# Patient Record
Sex: Male | Born: 1975 | Race: White | Hispanic: No | Marital: Single | Smoking: Former smoker
Health system: Southern US, Community
[De-identification: ages and names within clinical notes are randomized; demographics above are authoritative.]

## PROBLEM LIST (undated history)

## (undated) DIAGNOSIS — T148XXA Other injury of unspecified body region, initial encounter: Secondary | ICD-10-CM

## (undated) DIAGNOSIS — Z9889 Other specified postprocedural states: Secondary | ICD-10-CM

## (undated) DIAGNOSIS — R748 Abnormal levels of other serum enzymes: Secondary | ICD-10-CM

## (undated) DIAGNOSIS — L089 Local infection of the skin and subcutaneous tissue, unspecified: Secondary | ICD-10-CM

## (undated) DIAGNOSIS — S3981XA Other specified injuries of abdomen, initial encounter: Secondary | ICD-10-CM

## (undated) DIAGNOSIS — R112 Nausea with vomiting, unspecified: Secondary | ICD-10-CM

## (undated) DIAGNOSIS — M1990 Unspecified osteoarthritis, unspecified site: Secondary | ICD-10-CM

## (undated) DIAGNOSIS — K219 Gastro-esophageal reflux disease without esophagitis: Secondary | ICD-10-CM

## (undated) HISTORY — PX: BACK SURGERY: SHX140

## (undated) HISTORY — PX: VENTRICULOSTOMY: SUR1435

## (undated) HISTORY — PX: KNEE ARTHROSCOPY WITH MENISCAL REPAIR: SHX5653

## (undated) HISTORY — PX: FINGER SURGERY: SHX640

## (undated) HISTORY — PX: ANTERIOR CRUCIATE LIGAMENT REPAIR: SHX115

## (undated) HISTORY — PX: WISDOM TOOTH EXTRACTION: SHX21

## (undated) HISTORY — PX: RHINOPLASTY: SUR1284

## (undated) HISTORY — PX: ELBOW FRACTURE SURGERY: SHX616

---

## 2001-10-21 ENCOUNTER — Observation Stay (HOSPITAL_COMMUNITY): Admission: RE | Admit: 2001-10-21 | Discharge: 2001-10-22 | Payer: Self-pay | Admitting: Orthopedic Surgery

## 2003-10-06 ENCOUNTER — Emergency Department (HOSPITAL_COMMUNITY): Admission: EM | Admit: 2003-10-06 | Discharge: 2003-10-06 | Payer: Self-pay | Admitting: Emergency Medicine

## 2004-10-29 ENCOUNTER — Inpatient Hospital Stay (HOSPITAL_COMMUNITY): Admission: EM | Admit: 2004-10-29 | Discharge: 2004-10-30 | Payer: Self-pay | Admitting: Emergency Medicine

## 2007-09-30 ENCOUNTER — Emergency Department (HOSPITAL_COMMUNITY): Admission: EM | Admit: 2007-09-30 | Discharge: 2007-09-30 | Payer: Self-pay | Admitting: Emergency Medicine

## 2008-09-11 ENCOUNTER — Emergency Department (HOSPITAL_BASED_OUTPATIENT_CLINIC_OR_DEPARTMENT_OTHER): Admission: EM | Admit: 2008-09-11 | Discharge: 2008-09-11 | Payer: Self-pay | Admitting: Emergency Medicine

## 2008-09-11 ENCOUNTER — Ambulatory Visit: Payer: Self-pay | Admitting: Radiology

## 2009-02-17 ENCOUNTER — Encounter: Admission: RE | Admit: 2009-02-17 | Discharge: 2009-02-17 | Payer: Self-pay | Admitting: Family Medicine

## 2010-03-24 ENCOUNTER — Emergency Department (HOSPITAL_COMMUNITY)
Admission: EM | Admit: 2010-03-24 | Discharge: 2010-03-24 | Payer: Self-pay | Source: Home / Self Care | Admitting: Emergency Medicine

## 2010-04-04 ENCOUNTER — Emergency Department (HOSPITAL_COMMUNITY)
Admission: EM | Admit: 2010-04-04 | Discharge: 2010-04-04 | Payer: Self-pay | Source: Home / Self Care | Admitting: Emergency Medicine

## 2010-05-07 IMAGING — US US SCROTUM
1 series · 14 of 25 positions shown · non-contrast
Comparison: None available.

CLINICAL DATA: Right testicular pain radiating to the right hip.
Pulled groin muscle.

SCROTAL ULTRASOUND
DOPPLER ULTRASOUND OF THE TESTICLES
TECHNIQUE: Complete ultrasound examination of the testicles,
epididymis, and other scrotal structures was performed.  Color and
spectral Doppler ultrasound were also utilized to evaluate blood
flow to the testicles.

[Series 1: us scrotum · 0.08mm/px · 14 of 41 slices shown]
[im 1/41]
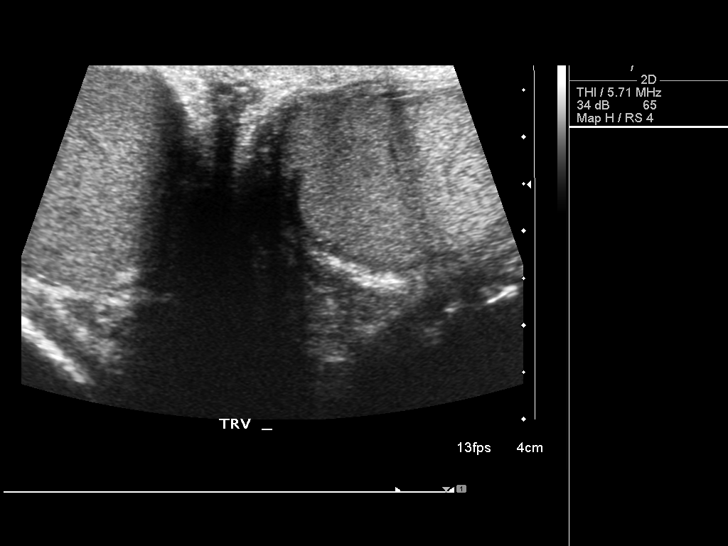
[im 4/41]
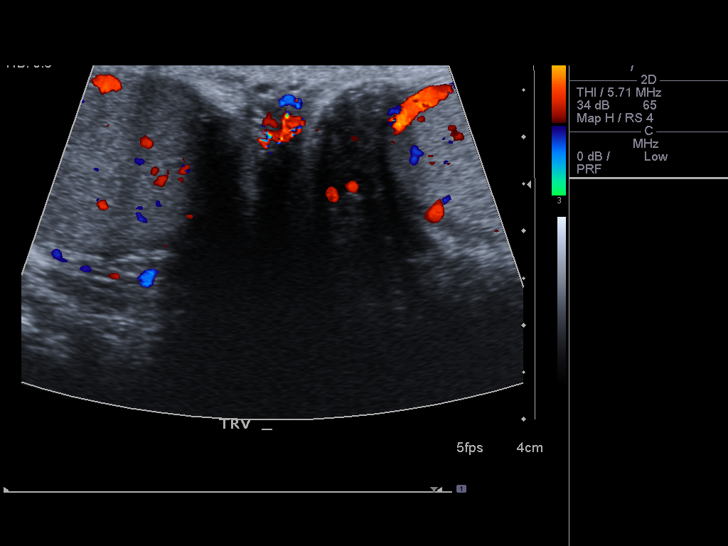
[im 7/41]
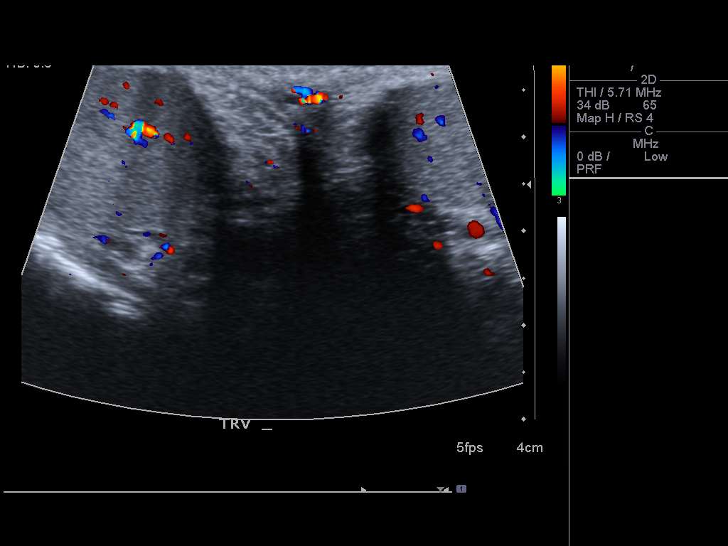
[im 11/41]
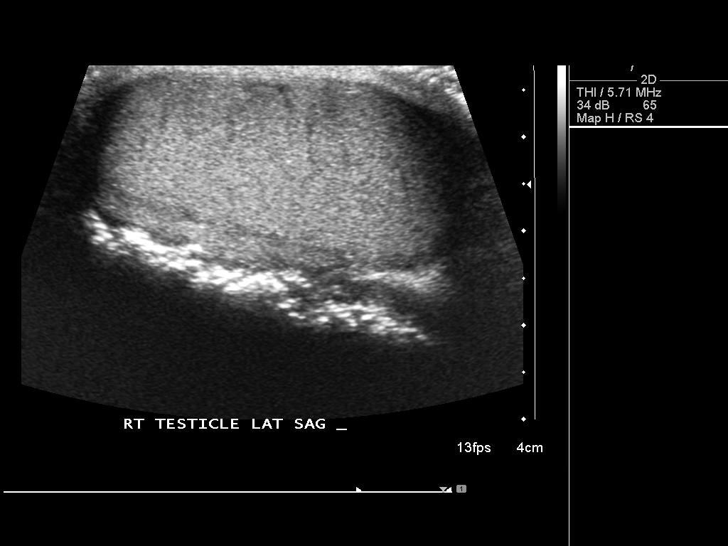
[im 14/41]
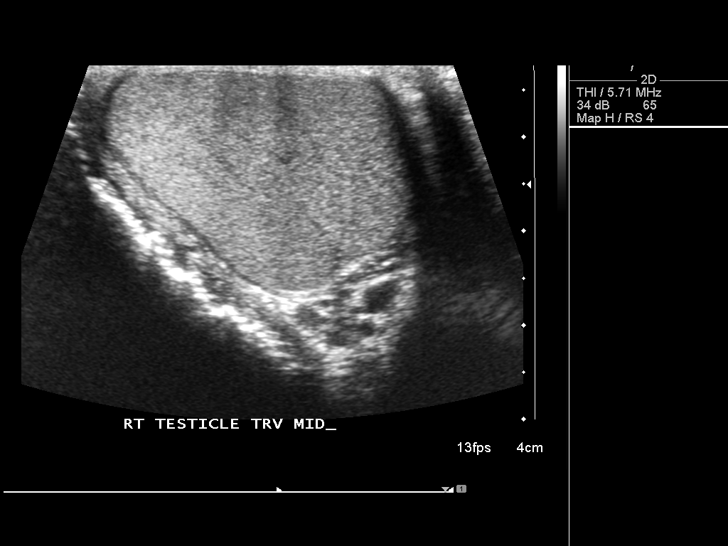
[im 16/41]
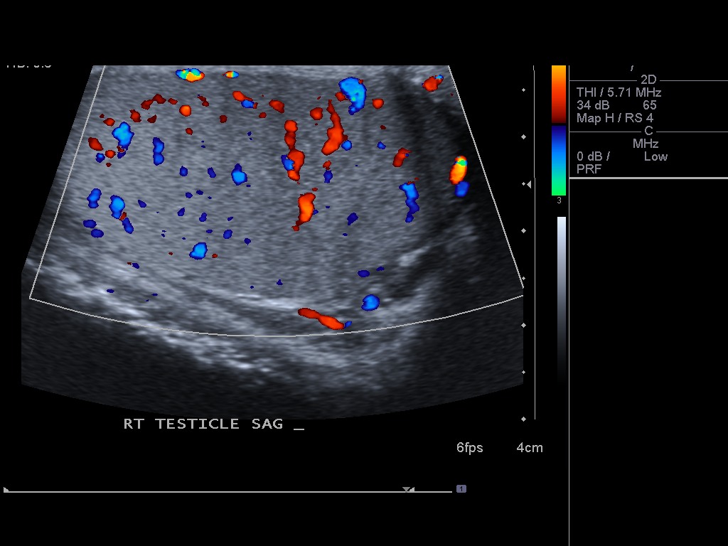
[im 19/41]
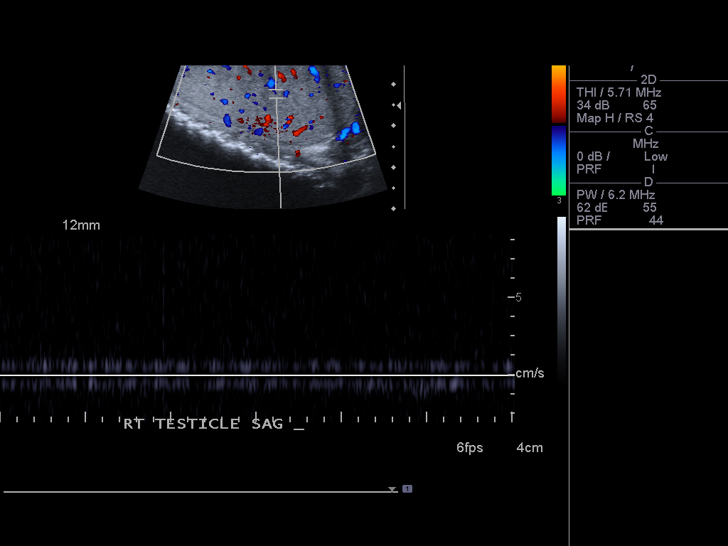
[im 22/41]
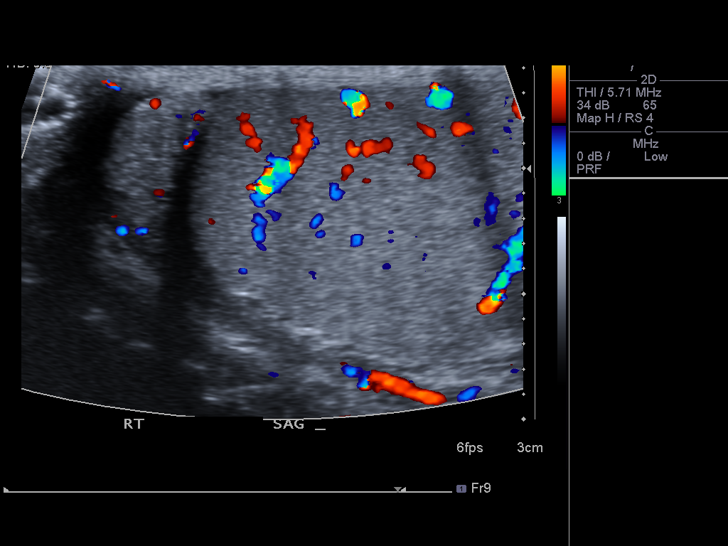
[im 26/41]
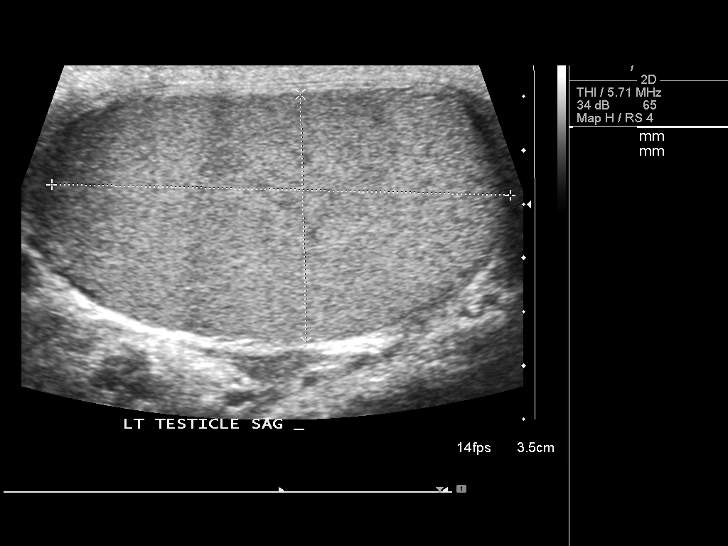
[im 27/41]
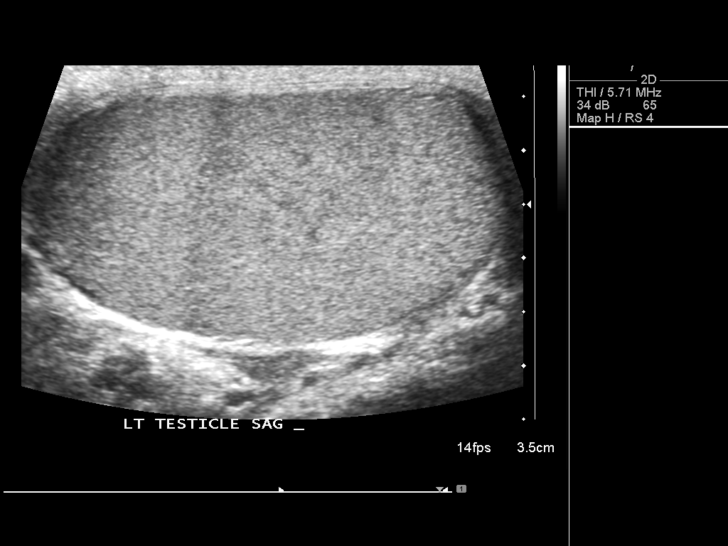
[im 31/41]
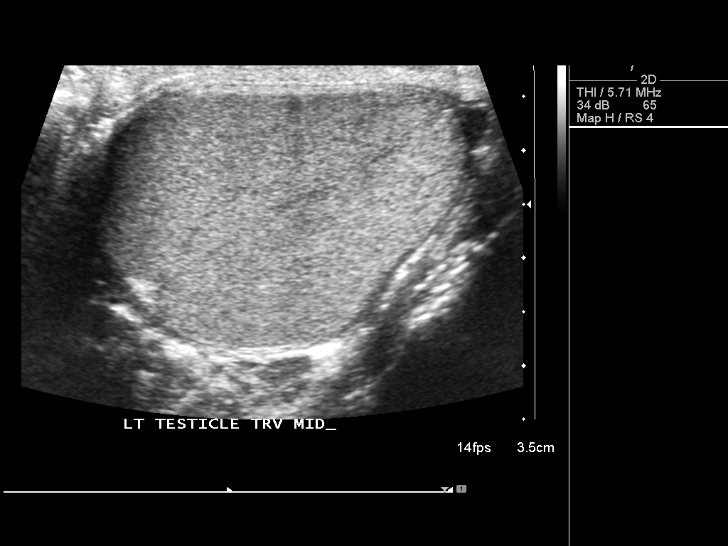
[im 34/41]
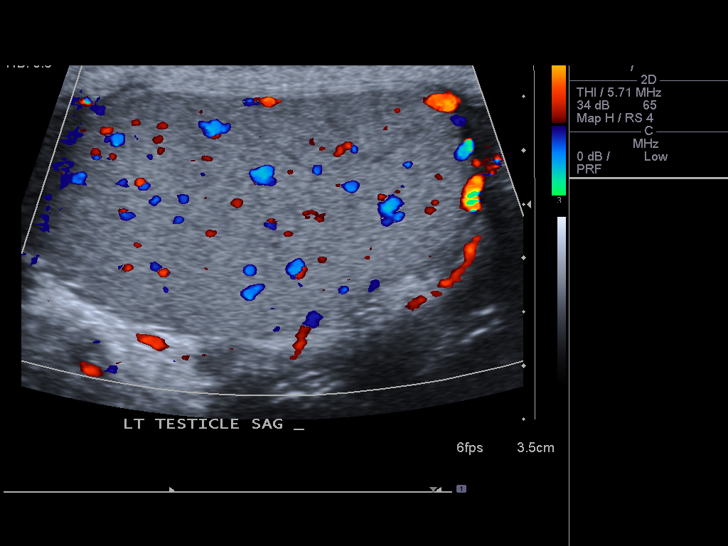
[im 37/41]
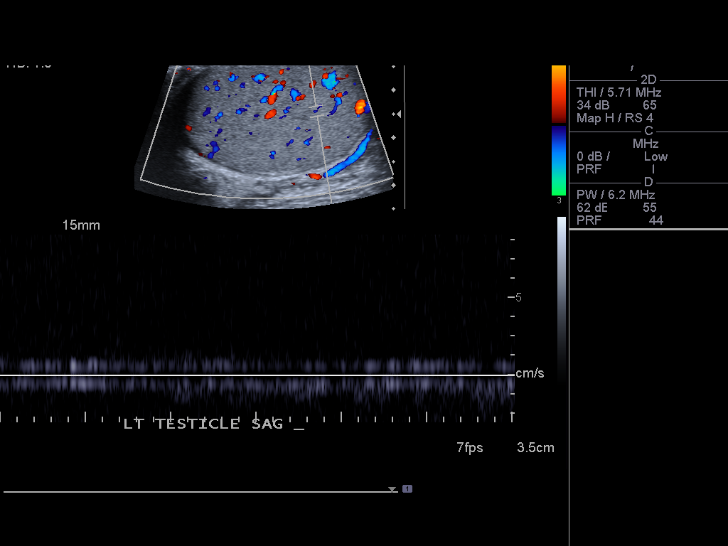
[im 41/41]
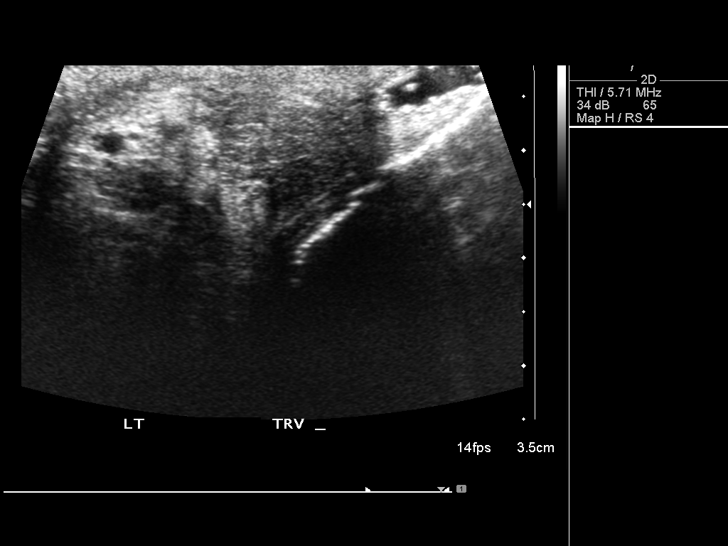

[14 of 25 positions shown; findings below may reference images not displayed]

FINDINGS: The testicles are of normal size and echotexture
bilaterally.  The right testicle measures 4.4 x 2.4 x 3.2 cm.  The
left testicle measures 4.3 x 2.3 x 3.3 cm.  Normal color Doppler
flow is seen bilaterally.  The color Doppler signal is symmetric.
Normal arterial and venous waveforms are present bilaterally.  The
epididymis is within normal limits bilaterally.  There is no
evidence for hydrocele or varicocele.
IMPRESSION: Normal bilateral scrotal ultrasound and color Doppler evaluation.

## 2010-07-30 LAB — GC/CHLAMYDIA PROBE AMP, GENITAL
Chlamydia, DNA Probe: NEGATIVE
GC Probe Amp, Genital: NEGATIVE

## 2010-07-30 LAB — URINALYSIS, ROUTINE W REFLEX MICROSCOPIC
Bilirubin Urine: NEGATIVE
Hgb urine dipstick: NEGATIVE
Ketones, ur: NEGATIVE mg/dL
Protein, ur: NEGATIVE mg/dL
Urobilinogen, UA: 0.2 mg/dL (ref 0.0–1.0)

## 2010-09-06 NOTE — Op Note (Signed)
[facility]  Patient:    Alexander Caldwell, MCPEEK Visit Number: [id number] MRN: [id number]          Service Type: SUR Location: 4W [id number] [id number] Attending Physician:  Marlowe Kays Page Dictated by:   Illene Labrador. Aplington, M.D. Proc. Date: 10/21/01 Admit Date:  10/21/2001 Discharge Date: 10/22/2001                             Operative Report  PREOPERATIVE DIAGNOSES: 1. Torn anterior cruciate ligament. 2. Torn medial meniscus, right knee.  POSTOPERATIVE DIAGNOSES: 1. Torn anterior cruciate ligament. 2. Torn medial meniscus, right knee.  OPERATION PERFORMED: 1. Arthroscopically assisted ACL reconstruction using the third patella    tendon technique. 2. Partial medial meniscectomy, right knee.  SURGEON:  Illene Labrador. Aplington, M.D.  ASSISTANT:  Malon Kindle, M.D.  ANESTHESIA:  General.  PATHOLOGY AND JUSTIFICATION FOR PROCEDURE:  Injury to his right knee playing basketball on Aug 30, 2001. MRI demonstrated the above pathology. After a thorough discussion with the patient, he is here today for the above repairs. He had the bucket-handle type tear of the medial meniscus. His joint surfaces looked good. He had a complete ACL tear.  DESCRIPTION OF PROCEDURE:  Prophylactic antibiotics, satisfactory general anesthesia, pneumatic tourniquet, thigh holder over the tourniquet, the right leg was prepped with Duraprep from tourniquet to ankle and draped in a sterile field. The anatomy of the knee portals and the incision for the taking of the patellar tendon graft were all outlined. I first arthroscoped him because his knee was stable and he did have a complete tear with the ACL adherent to the PCL. He also had the large bucket-handle type tear of the medial meniscus. We then went ahead and esmarched out the leg and I made an incision down to the patellar tendon curving slightly medial to the tibial tubercle. The patella tendon measured about 40 mm in length and I took  roughly the middle 10 mm taking 20 mm of graft from the patella and 25 from the tibia using microsaw. Dr. Ranell Patrick prepared the grafts with size 10 for the graft and 10 mm for the graft and two #5 Ethibond sutures through each end of the graft and marked the bone graft patella tendon junction. Meanwhile I was using superomedial saline inflow and through an anterolateral portal cut the bucket handle portion of the meniscus anteriorly and posteriorly and removed the large fragment. I shaved down the anterior attachment. Posteriorly he was quite tight. I came back to this area later. I then arthroscoped the suprapatellar area, found no abnormalities and then reversed portals with the lateral compartment of the knee joint being normal. Then with the camera through the anterolateral portal, I used a 4.2 shaver and probe to remove the ACL remnants and then thought that perhaps there would be a little more flexibility to his knee but still we had a difficult time even with Dr. Ranell Patrick assisting me in mobilizing the leg to get all of the posterior remnants of the medial meniscus from the intercondylar area. Accordingly, I shaved down the posterior remnant with a 3.5 shaver until smooth and we did not not want to scrape his joint surfaces. I then went back to the ACL reconstruction portion where I cleaned the remaining soft tissue from the intercondylar notch and we then brought in a curette scraping the bone back over the top bone and brought in the 4-0 oval bur and  began burring down the arch and residence ridge until we had an adequate arch for the graft. Because the tendon portion of the graft measured 105 mm, I made the tunnel a little longer in the tibia to accommodate for this and we set the stylus for the tibial hole at 60 degrees rather than the normal 55. A guidepin was placed and was found to be in position, protected with curette and over drilled. A remnant of bony fibrous tissue was removed  from the tibial plateau to make a nice smooth hole and then this was grasped from below. I then placed the footprint guide into the posterior notch around the over the top position and placed a guide pin through the tibial tunnel up to through the lateral femoral condyle and this appeared to be in good position. I then used a 10 mm C reamer and made a footprint with it and found it to have a good posterior bone coverage so I then inserted it for 30 mm for the 25 mm plug. I then removed the guide pin and smoothed this hole as well and then placed a two pin passer exiting out the anterior lateral thigh. The pin for the interference screw was placed through the medial portal and the graft was then introduced from below and we carefully placed the 25 mm portion up into the femur until it was well inside the hole we had created and I then placed a 7 mm interference screw over the guidepin locking this in. This gave a nice secure fit. We then checked to make sure that he had a negative anterior drawer sign and Lachman test. There was a slight amount of graft protruding in the tibia and we elected to use two techmatic staples to impact this to stabilize it to preserve all our graft. I then checked the stability and it was excellent. We then removed several small bone fragments in the joint, took final pictures and closed. Some of the graft left over from the plugs was used to fill the patella gap. I then closed over it with interrupted #0 Vicryl in the patellar tendon reapproximating it and the sheath over top with the same. The subcutaneous tissue was closed with 2-0 Vicryl, staples were placed at all portal sites and on this incision and I injected the portal incision with 0.5% Marcaine with adrenaline and also 20 mm in the joint. The inflap rasp was then removed and this portal closed with staples as well. The tourniquet had been released at 2 hours of tourniquet time and was down by now.  Betadine Adaptic dry sterile dressing were applied. His leg was placed in the immobilizer. He tolerated the procedure well and was taken to the recovery room in  satisfactory condition with no known complications. Dictated by:   Illene Labrador. Aplington, M.D. Attending Physician:  Joaquin Courts DD:  10/21/01 TD:  10/25/01 Job: [id number]

## 2010-11-24 ENCOUNTER — Emergency Department (HOSPITAL_COMMUNITY): Payer: Worker's Compensation

## 2010-11-24 ENCOUNTER — Emergency Department (HOSPITAL_COMMUNITY)
Admission: EM | Admit: 2010-11-24 | Discharge: 2010-11-24 | Disposition: A | Discharge status: Home / Self Care | Payer: Worker's Compensation | Attending: Emergency Medicine | Admitting: Emergency Medicine

## 2010-11-24 DIAGNOSIS — S0003XA Contusion of scalp, initial encounter: Secondary | ICD-10-CM | POA: Insufficient documentation

## 2010-11-24 DIAGNOSIS — S0990XA Unspecified injury of head, initial encounter: Secondary | ICD-10-CM | POA: Insufficient documentation

## 2010-11-24 DIAGNOSIS — M542 Cervicalgia: Secondary | ICD-10-CM | POA: Insufficient documentation

## 2010-11-24 DIAGNOSIS — R51 Headache: Secondary | ICD-10-CM | POA: Insufficient documentation

## 2010-11-24 DIAGNOSIS — IMO0002 Reserved for concepts with insufficient information to code with codable children: Secondary | ICD-10-CM | POA: Insufficient documentation

## 2010-11-24 DIAGNOSIS — S0083XA Contusion of other part of head, initial encounter: Secondary | ICD-10-CM | POA: Insufficient documentation

## 2010-11-24 DIAGNOSIS — S139XXA Sprain of joints and ligaments of unspecified parts of neck, initial encounter: Secondary | ICD-10-CM | POA: Insufficient documentation

## 2012-06-12 ENCOUNTER — Encounter (HOSPITAL_BASED_OUTPATIENT_CLINIC_OR_DEPARTMENT_OTHER): Payer: Self-pay

## 2012-06-12 ENCOUNTER — Emergency Department (HOSPITAL_BASED_OUTPATIENT_CLINIC_OR_DEPARTMENT_OTHER)
Admission: EM | Admit: 2012-06-12 | Discharge: 2012-06-12 | Disposition: A | Discharge status: Home / Self Care | Payer: Worker's Compensation | Attending: Emergency Medicine | Admitting: Emergency Medicine

## 2012-06-12 ENCOUNTER — Emergency Department (HOSPITAL_BASED_OUTPATIENT_CLINIC_OR_DEPARTMENT_OTHER): Payer: Worker's Compensation

## 2012-06-12 DIAGNOSIS — Y9289 Other specified places as the place of occurrence of the external cause: Secondary | ICD-10-CM | POA: Insufficient documentation

## 2012-06-12 DIAGNOSIS — S93409A Sprain of unspecified ligament of unspecified ankle, initial encounter: Secondary | ICD-10-CM | POA: Insufficient documentation

## 2012-06-12 DIAGNOSIS — Y939 Activity, unspecified: Secondary | ICD-10-CM | POA: Insufficient documentation

## 2012-06-12 DIAGNOSIS — X500XXA Overexertion from strenuous movement or load, initial encounter: Secondary | ICD-10-CM | POA: Insufficient documentation

## 2012-06-12 DIAGNOSIS — Y990 Civilian activity done for income or pay: Secondary | ICD-10-CM | POA: Insufficient documentation

## 2012-06-12 DIAGNOSIS — F17200 Nicotine dependence, unspecified, uncomplicated: Secondary | ICD-10-CM | POA: Insufficient documentation

## 2012-06-12 DIAGNOSIS — S8392XA Sprain of unspecified site of left knee, initial encounter: Secondary | ICD-10-CM

## 2012-06-12 DIAGNOSIS — R269 Unspecified abnormalities of gait and mobility: Secondary | ICD-10-CM | POA: Insufficient documentation

## 2012-06-12 NOTE — ED Provider Notes (Signed)
History     CSN: [id number]  Arrival date & time 06/12/12  5621   First MD Initiated Contact with Patient 06/12/12 [phone]      Chief Complaint  Patient presents with  . Knee Pain    (Consider location/radiation/quality/duration/timing/severity/associated sxs/prior treatment) HPI Comments: 37 y.o PMH right ACL repair.  He presents after hyperextending his knee stepping down from a ladder at work last night.  Left knee turned inward.  He already hyperextended his left knee 3 weeks ago.  He her a popping noise, pain has worsened overnight and he has noticed swelling.  Bearing weight walking hurts, pain is 7-8/10. Ibuprofen helped this am. Rest makes worse.  It is tender to touch lateral and medial to his patella.  He has discomfort and left leg feels tight.  Pain is also posterior.  He states he has hyperextended both knees previously playing basketball.    PMH: cracked sinus cavity, broken vertebra  Patient is a 37 y.o. male presenting with knee pain. The history is provided by the patient. No language interpreter was used.  Knee Pain Location:  Knee Knee location:  L knee Pain details:    Radiates to:  Does not radiate   Progression:  Worsening Chronicity:  New Foreign body present:  No foreign bodies Relieved by:  NSAIDs Worsened by:  Activity Associated symptoms: decreased ROM   Associated symptoms comment:  Decreased extension   History reviewed. No pertinent past medical history.  Past Surgical History  Procedure Laterality Date  . Anterior cruciate ligament repair Right   . Ventriculostomy      History reviewed. No pertinent family history.  History  Substance Use Topics  . Smoking status: Current Some Day Smoker    Types: Cigarettes  . Smokeless tobacco: Never Used  . Alcohol Use: Yes     Comment: occasionally      Review of Systems  Musculoskeletal: Positive for joint swelling and gait problem.  All other systems reviewed and are negative.    Allergies   Aspirin and Hydrocodone  Home Medications   Current Outpatient Rx  Name  Route  Sig  Dispense  Refill  . ibuprofen (ADVIL,MOTRIN) 200 MG tablet   Oral   Take 600 mg by mouth every 6 (six) hours as needed for pain.           BP 128/91  Pulse 110  Temp(Src) 98.9 F (37.2 C)  Resp 18  Ht 6' (1.829 m)  Wt 200 lb (90.719 kg)  BMI 27.12 kg/m2  SpO2 95%  Physical Exam  Nursing note and vitals reviewed. Constitutional: He is oriented to person, place, and time. He appears well-developed and well-nourished. He is cooperative.  HENT:  Head: Normocephalic and atraumatic.  Mouth/Throat: Oropharynx is clear and moist and mucous membranes are normal. No oropharyngeal exudate.  Eyes: Conjunctivae are normal. Pupils are equal, round, and reactive to light. Right eye exhibits no discharge. Left eye exhibits no discharge. No scleral icterus.  Cardiovascular: Regular rhythm, S1 normal, S2 normal and normal heart sounds.  Tachycardia present.   No murmur heard. Pulmonary/Chest: Effort normal and breath sounds normal.  Abdominal: Soft. Bowel sounds are normal. He exhibits no distension. There is no tenderness.  Musculoskeletal: He exhibits no edema.       Left knee: He exhibits decreased range of motion. Tenderness found. Medial joint line and lateral joint line tenderness noted.  Flexion intact Not able to fully extend knee  ttp medially and lateral joint line and  distal to patella Min. Edema   Neurological: He is alert and oriented to person, place, and time.  Skin: Skin is warm, dry and intact. No rash noted.  Multiple tattoos  Psychiatric: He has a normal mood and affect. His speech is normal and behavior is normal. Judgment and thought content normal. Cognition and memory are normal.    ED Course  Procedures (including critical care time)  Labs Reviewed - No data to display Dg Knee Complete 4 Views Left  06/12/2012  *RADIOLOGY REPORT*  Clinical Data: Pain and swelling post  injury yesterday  LEFT KNEE - COMPLETE 4+ VIEW  Comparison: None.  Findings: Four views of the left knee submitted.  No acute fracture or subluxation.  Small joint effusion.  IMPRESSION: No acute fracture or subluxation.  Small joint effusion.   Original Report Authenticated By: Natasha Mead, M.D.      1. Left knee sprain       MDM  Likely MCL sprain  Shirlee Latch MD [phone]        Annett Gula, MD 06/12/12 1017

## 2012-06-12 NOTE — ED Provider Notes (Addendum)
I saw and evaluated the patient, reviewed the resident's note and I agree with the findings and plan. The patient presents after twisting his knee yesterday at work stepping down from a ladder.  Pain and swelling since that time.  On exam, the patient is afebrile and the vitals are stable.  The left knee appears grossly normal, with possible a very small effusion.  There is ttp over the medial collateral ligament.  There is good range of motion without crepitus.  The knee is stable ap and laterally.    This appears to be a sprain.  The xrays do not reveal fracture, only a small effusion.  Will treat with rest, ace, nsaids, follow up prn.  Work excuse given.   Geoffery Lyons, MD 06/12/12 1041  Geoffery Lyons, MD 06/12/12 1043

## 2012-06-12 NOTE — ED Notes (Signed)
Pt states that he stepped on a cable yesterday at work and his R knee flexed inward, popping, and causing immediate pain.  Swelling noted.

## 2012-07-12 ENCOUNTER — Emergency Department (HOSPITAL_BASED_OUTPATIENT_CLINIC_OR_DEPARTMENT_OTHER): Payer: Self-pay | Attending: Emergency Medicine

## 2012-07-12 ENCOUNTER — Encounter (HOSPITAL_BASED_OUTPATIENT_CLINIC_OR_DEPARTMENT_OTHER): Payer: Self-pay

## 2012-07-12 ENCOUNTER — Emergency Department (HOSPITAL_BASED_OUTPATIENT_CLINIC_OR_DEPARTMENT_OTHER)
Admission: EM | Admit: 2012-07-12 | Discharge: 2012-07-12 | Disposition: A | Discharge status: Home / Self Care | Payer: Worker's Compensation | Attending: Emergency Medicine | Admitting: Emergency Medicine

## 2012-07-12 DIAGNOSIS — Z9889 Other specified postprocedural states: Secondary | ICD-10-CM | POA: Insufficient documentation

## 2012-07-12 DIAGNOSIS — M25462 Effusion, left knee: Secondary | ICD-10-CM

## 2012-07-12 DIAGNOSIS — M25469 Effusion, unspecified knee: Secondary | ICD-10-CM | POA: Insufficient documentation

## 2012-07-12 DIAGNOSIS — Z791 Long term (current) use of non-steroidal anti-inflammatories (NSAID): Secondary | ICD-10-CM | POA: Insufficient documentation

## 2012-07-12 DIAGNOSIS — F17200 Nicotine dependence, unspecified, uncomplicated: Secondary | ICD-10-CM | POA: Insufficient documentation

## 2012-07-12 MED ORDER — OXYCODONE-ACETAMINOPHEN 5-325 MG PO TABS
2.0000 | ORAL_TABLET | Freq: Once | ORAL | Status: AC
  Administered 2012-07-12: 2 via ORAL
  Filled 2012-07-12 (×2): qty 2

## 2012-07-12 MED ORDER — OXYCODONE-ACETAMINOPHEN 5-325 MG PO TABS: ORAL_TABLET | ORAL | Status: DC

## 2012-07-12 NOTE — ED Provider Notes (Signed)
History     CSN: [id number]  Arrival date & time 07/12/12  1813   First MD Initiated Contact with Patient 07/12/12 1931      Chief Complaint  Patient presents with  . Knee Pain    (Consider location/radiation/quality/duration/timing/severity/associated sxs/prior treatment) HPI  Alexander Caldwell is a 37 y.o. male complaining of pain and swelling to left knee. Patient was leaving his house and twisted the knee when he was walking down the steps. He did not fall there was no direct trauma to the knee. His pain sharply increased after this incident. Patient had prior injury to the knee in February. He reports the pain is severe, 8/10, exacerbated by both movement and weightbearing. He denies numbness or paresthesia.  History reviewed. No pertinent past medical history.  Past Surgical History  Procedure Laterality Date  . Anterior cruciate ligament repair Right   . Ventriculostomy      History reviewed. No pertinent family history.  History  Substance Use Topics  . Smoking status: Current Some Day Smoker    Types: Cigarettes  . Smokeless tobacco: Never Used  . Alcohol Use: Yes     Comment: occasionally      Review of Systems  Constitutional: Negative for fever.  Respiratory: Negative for shortness of breath.   Cardiovascular: Negative for chest pain.  Gastrointestinal: Negative for nausea, vomiting, abdominal pain and diarrhea.  Musculoskeletal: Positive for joint swelling and arthralgias.  All other systems reviewed and are negative.    Allergies  Aspirin and Hydrocodone  Home Medications   Current Outpatient Rx  Name  Route  Sig  Dispense  Refill  . ibuprofen (ADVIL,MOTRIN) 200 MG tablet   Oral   Take 600 mg by mouth every 6 (six) hours as needed for pain.         . naproxen (NAPROSYN) 250 MG tablet   Oral   Take 250 mg by mouth 2 (two) times daily with a meal.           BP 119/81  Pulse 93  Temp(Src) 98.1 F (36.7 C) (Oral)  Resp 18  Ht 6' (1.829  m)  Wt 203 lb (92.08 kg)  BMI 27.53 kg/m2  SpO2 98%  Physical Exam  Nursing note and vitals reviewed. Constitutional: He is oriented to person, place, and time. He appears well-developed and well-nourished. No distress.  HENT:  Head: Normocephalic.  Mouth/Throat: Oropharynx is clear and moist.  Eyes: Conjunctivae and EOM are normal. Pupils are equal, round, and reactive to light.  Neck: Normal range of motion. Neck supple.  Cardiovascular: Normal rate.   Pulmonary/Chest: Effort normal and breath sounds normal. No stridor. No respiratory distress. He has no wheezes. He has no rales. He exhibits no tenderness.  Abdominal: Soft.  Musculoskeletal: Normal range of motion.  Left knee:  No deformity, erythema or abrasions. FROM. mild effusion, no warmth. Anterior and posterior drawer show no abnormal laxity. Stable to valgus and varus stress. Joint lines are non-tender. Neurovascularly intact. Pt ambulates with antalgic gait.   Neurological: He is alert and oriented to person, place, and time.  Psychiatric: He has a normal mood and affect.    ED Course  Procedures (including critical care time)  Labs Reviewed - No data to display Dg Knee Complete 4 Views Left  07/12/2012  *RADIOLOGY REPORT*  Clinical Data: Left knee pain.  LEFT KNEE - COMPLETE 4+ VIEW  Comparison: 06/12/2012.  Findings: The joint spaces are maintained.  No acute bony findings or osteochondral  abnormality.  There is a moderate sized joint effusion.  IMPRESSION:  1.  No acute bony findings or degenerative changes. 2.  Moderate sized joint effusion.   Original Report Authenticated By: Rudie Meyer, M.D.      1. Knee effusion, left       MDM   Alexander Caldwell is a 37 y.o. male with secondary trauma to left knee. X-ray shows no bony abnormality, physical exam shows no abnormal laxity consistent with ligamentous injury. We'll put him in a knee immobilizer, crutches and encourage outpatient followup. Patient has requested a  shot to "knock him out." I advised that I cannot do that, but I will try to control his pain.   Filed Vitals:   07/12/12 1835  BP: 119/81  Pulse: 93  Temp: 98.1 F (36.7 C)  TempSrc: Oral  Resp: 18  Height: 6' (1.829 m)  Weight: 203 lb (92.08 kg)  SpO2: 98%     Pt verbalized understanding and agrees with care plan. Outpatient follow-up and return precautions given.    Discharge Medication List as of 07/12/2012  7:38 PM    START taking these medications   Details  oxyCODONE-acetaminophen (PERCOCET/ROXICET) 5-325 MG per tablet 1 to 2 tabs PO q6hrs  PRN for pain, Print               Wynetta Emery, PA-C 07/13/12 0110

## 2012-07-12 NOTE — ED Notes (Signed)
Pt states that he stepped out his door at home and injured his Knee, heard a pop and crunch,.  No other injures.  PMS intact, knee tender to palpation.

## 2012-07-12 NOTE — ED Notes (Signed)
Pt states ride is 5-10 min away. Pt awaiting ride to received medication for pain.

## 2012-07-14 NOTE — ED Provider Notes (Signed)
Medical screening examination/treatment/procedure(s) were performed by non-physician practitioner and as supervising physician I was immediately available for consultation/collaboration.  Ethelda Chick, MD 07/14/12 [phone]

## 2013-12-14 ENCOUNTER — Emergency Department (HOSPITAL_COMMUNITY)
Admission: EM | Admit: 2013-12-14 | Discharge: 2013-12-15 | Disposition: A | Discharge status: Home / Self Care | Payer: BC Managed Care – PPO | Attending: Emergency Medicine | Admitting: Emergency Medicine

## 2013-12-14 DIAGNOSIS — X838XXA Intentional self-harm by other specified means, initial encounter: Secondary | ICD-10-CM | POA: Insufficient documentation

## 2013-12-14 DIAGNOSIS — Z791 Long term (current) use of non-steroidal anti-inflammatories (NSAID): Secondary | ICD-10-CM | POA: Diagnosis not present

## 2013-12-14 DIAGNOSIS — S7010XA Contusion of unspecified thigh, initial encounter: Secondary | ICD-10-CM | POA: Insufficient documentation

## 2013-12-14 DIAGNOSIS — S300XXA Contusion of lower back and pelvis, initial encounter: Secondary | ICD-10-CM | POA: Diagnosis not present

## 2013-12-14 DIAGNOSIS — R454 Irritability and anger: Secondary | ICD-10-CM

## 2013-12-14 DIAGNOSIS — F17200 Nicotine dependence, unspecified, uncomplicated: Secondary | ICD-10-CM | POA: Insufficient documentation

## 2013-12-14 DIAGNOSIS — Z23 Encounter for immunization: Secondary | ICD-10-CM | POA: Diagnosis not present

## 2013-12-14 DIAGNOSIS — IMO0002 Reserved for concepts with insufficient information to code with codable children: Secondary | ICD-10-CM | POA: Diagnosis not present

## 2013-12-14 DIAGNOSIS — Z7289 Other problems related to lifestyle: Secondary | ICD-10-CM

## 2013-12-14 DIAGNOSIS — F911 Conduct disorder, childhood-onset type: Secondary | ICD-10-CM | POA: Diagnosis present

## 2013-12-14 DIAGNOSIS — R4689 Other symptoms and signs involving appearance and behavior: Secondary | ICD-10-CM

## 2013-12-14 LAB — CBC
HCT: 49.1 % (ref 39.0–52.0)
HEMOGLOBIN: 17.6 g/dL — AB (ref 13.0–17.0)
MCH: 30.8 pg (ref 26.0–34.0)
MCHC: 35.8 g/dL (ref 30.0–36.0)
MCV: 86 fL (ref 78.0–100.0)
Platelets: 255 10*3/uL (ref 150–400)
RBC: 5.71 MIL/uL (ref 4.22–5.81)
RDW: 12.4 % (ref 11.5–15.5)
WBC: 11.4 10*3/uL — AB (ref 4.0–10.5)

## 2013-12-14 MED ORDER — TETANUS-DIPHTH-ACELL PERTUSSIS 5-2.5-18.5 LF-MCG/0.5 IM SUSP
0.5000 mL | Freq: Once | INTRAMUSCULAR | Status: AC
  Administered 2013-12-14: 0.5 mL via INTRAMUSCULAR
  Filled 2013-12-14: qty 0.5

## 2013-12-14 MED ORDER — ACETAMINOPHEN 325 MG PO TABS: 650.0000 mg | ORAL_TABLET | ORAL | Status: DC | PRN

## 2013-12-14 MED ORDER — LORAZEPAM 1 MG PO TABS
1.0000 mg | ORAL_TABLET | Freq: Three times a day (TID) | ORAL | Status: DC | PRN
  Administered 2013-12-14: 1 mg via ORAL
  Filled 2013-12-14: qty 1

## 2013-12-14 MED ORDER — ONDANSETRON HCL 4 MG PO TABS
4.0000 mg | ORAL_TABLET | Freq: Three times a day (TID) | ORAL | Status: DC | PRN
  Administered 2013-12-15: 4 mg via ORAL
  Filled 2013-12-14: qty 1

## 2013-12-14 MED ORDER — ZOLPIDEM TARTRATE 5 MG PO TABS: 5.0000 mg | ORAL_TABLET | Freq: Every evening | ORAL | Status: DC | PRN

## 2013-12-14 NOTE — ED Notes (Signed)
Patient combative, verbally aggressive. States "I am not crazy. I am a prisoner. F*#k psychiatrist". Reports a fall off stage 8/24. Large bruise on right hip. States he was doing nothing wrong. When writer asked if he wanted pain intervention patient referred to a lethal dose of medication then commented that he knew it is illegal in [place] to assist with suicide. States he wants no more "forced dope" from hospital.  Patient encouraged to rest.  Q 15 safety checks in place.
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
## 2013-12-14 NOTE — ED Notes (Signed)
Pt taken to bathroom by [employer], changed into scrubs. Pt provided sample in cup, specimen clear and cold to touch. Advised pt we will attempt to recollect at a later date.
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
## 2013-12-15 ENCOUNTER — Encounter (HOSPITAL_COMMUNITY): Payer: Self-pay | Admitting: Registered Nurse

## 2013-12-15 DIAGNOSIS — R454 Irritability and anger: Secondary | ICD-10-CM | POA: Diagnosis present

## 2013-12-15 DIAGNOSIS — F39 Unspecified mood [affective] disorder: Secondary | ICD-10-CM

## 2013-12-15 LAB — COMPREHENSIVE METABOLIC PANEL
ALK PHOS: 85 U/L (ref 39–117)
ALT: 69 U/L — AB (ref 0–53)
AST: 42 U/L — AB (ref 0–37)
Albumin: 4.6 g/dL (ref 3.5–5.2)
Anion gap: 20 — ABNORMAL HIGH (ref 5–15)
BILIRUBIN TOTAL: 0.4 mg/dL (ref 0.3–1.2)
BUN: 6 mg/dL (ref 6–23)
CALCIUM: 9.9 mg/dL (ref 8.4–10.5)
CHLORIDE: 105 meq/L (ref 96–112)
CO2: 18 meq/L — AB (ref 19–32)
Creatinine, Ser: 0.75 mg/dL (ref 0.50–1.35)
GLUCOSE: 107 mg/dL — AB (ref 70–99)
POTASSIUM: 3.7 meq/L (ref 3.7–5.3)
SODIUM: 143 meq/L (ref 137–147)
Total Protein: 8.5 g/dL — ABNORMAL HIGH (ref 6.0–8.3)

## 2013-12-15 LAB — BASIC METABOLIC PANEL
Anion gap: 15 (ref 5–15)
BUN: 6 mg/dL (ref 6–23)
CALCIUM: 9.3 mg/dL (ref 8.4–10.5)
CO2: 22 mEq/L (ref 19–32)
Chloride: 106 mEq/L (ref 96–112)
Creatinine, Ser: 0.77 mg/dL (ref 0.50–1.35)
Glucose, Bld: 101 mg/dL — ABNORMAL HIGH (ref 70–99)
POTASSIUM: 4.1 meq/L (ref 3.7–5.3)
SODIUM: 143 meq/L (ref 137–147)

## 2013-12-15 LAB — BLOOD GAS, VENOUS
Acid-base deficit: 1.7 mmol/L (ref 0.0–2.0)
BICARBONATE: 23.1 meq/L (ref 20.0–24.0)
FIO2: 0.21 %
O2 Saturation: 78.5 %
PATIENT TEMPERATURE: 99
TCO2: 19.8 mmol/L (ref 0–100)
pCO2, Ven: 41.6 mmHg — ABNORMAL LOW (ref 45.0–50.0)
pH, Ven: 7.364 — ABNORMAL HIGH (ref 7.250–7.300)
pO2, Ven: 44.5 mmHg (ref 30.0–45.0)

## 2013-12-15 LAB — ACETAMINOPHEN LEVEL: Acetaminophen (Tylenol), Serum: <15 ug/mL (ref 10–30)

## 2013-12-15 LAB — RAPID URINE DRUG SCREEN, HOSP PERFORMED
AMPHETAMINES: NOT DETECTED
BARBITURATES: NOT DETECTED
BENZODIAZEPINES: POSITIVE — AB
Cocaine: NOT DETECTED
Opiates: NOT DETECTED
TETRAHYDROCANNABINOL: POSITIVE — AB

## 2013-12-15 LAB — ETHANOL: Alcohol, Ethyl (B): 187 mg/dL — ABNORMAL HIGH (ref 0–11)

## 2013-12-15 LAB — SALICYLATE LEVEL: Salicylate Lvl: <2 mg/dL — ABNORMAL LOW (ref 2.8–20.0)

## 2013-12-15 MED ORDER — VITAMIN B-1 100 MG PO TABS
100.0000 mg | ORAL_TABLET | Freq: Every day | ORAL | Status: DC
  Administered 2013-12-15: 100 mg via ORAL
  Filled 2013-12-15: qty 1

## 2013-12-15 MED ORDER — LORAZEPAM 1 MG PO TABS: 0.0000 mg | ORAL_TABLET | Freq: Two times a day (BID) | ORAL | Status: DC

## 2013-12-15 MED ORDER — LORAZEPAM 1 MG PO TABS
0.0000 mg | ORAL_TABLET | Freq: Four times a day (QID) | ORAL | Status: DC
  Administered 2013-12-15: 1 mg via ORAL
  Filled 2013-12-15: qty 1

## 2013-12-15 MED ORDER — THIAMINE HCL 100 MG/ML IJ SOLN: 100.0000 mg | Freq: Every day | INTRAMUSCULAR | Status: DC

## 2013-12-15 NOTE — BH Assessment (Signed)
Relayed results of assessment to Donell Sievert, PA. Per Donell Sievert, PA pt should be reevaluated by psychiatry later in AM for final disposition recommendations and to determine whether to uphold or rescind IVC.   Clista Bernhardt, [facility] Triage Specialist 12/15/2013 4:36 AM

## 2013-12-15 NOTE — Discharge Instructions (Signed)
Aggression   Physically aggressive behavior is common among small children. When frustrated or angry, toddlers may act out. Often, they will push, bite, or hit. Most children show less physical aggression as they grow up. Their language and interpersonal skills improve, too. But continued aggressive behavior is a sign of a problem. This behavior can lead to aggression and delinquency in adolescence and adulthood.   Aggressive behavior can be psychological or physical. Forms of psychological aggression include threatening or bullying others. Forms of physical aggression include:   Pushing.   Hitting.   Slapping.   Kicking.   Stabbing.   Shooting.   Raping.   PREVENTION   Encouraging the following behaviors can help manage aggression:   Respecting others and valuing differences.   Participating in school and community functions, including sports, music, after-school programs, community groups, and volunteer work.   Talking with an adult when they are sad, depressed, fearful, anxious, or angry. Discussions with a parent or other family member, counselor, teacher, or coach can help.   Avoiding alcohol and drug use.   Dealing with disagreements without aggression, such as conflict resolution. To learn this, children need parents and caregivers to model respectful communication and problem solving.   Limiting exposure to aggression and violence, such as video games that are not age appropriate, violence in the media, or domestic violence.  Document Released: 02/02/2007 Document Revised: 06/30/2011 Document Reviewed: 06/13/2010   ExitCare Patient Information 2015 [employer]. This information is not intended to replace advice given to you by your health care provider. Make sure you discuss any questions you have with your health care provider.     Anger Management   Anger is a normal human emotion. However, anger can range from mild irritation to rage. When your anger becomes harmful to yourself or others, it is  unhealthy anger.   CAUSES   There are many reasons for unhealthy anger. Many people learn how to express anger from observing how their family expressed anger. In troubled, chaotic, or abusive families, anger can be expressed as rage or even violence. Children can grow up never learning how healthy anger can be expressed. Factors that contribute to unhealthy anger include:   Drug or alcohol abuse.   Post-traumatic stress disorder.   Traumatic brain injury.  COMPLICATIONS   People with unhealthy anger tend to overreact and retaliate against a real or imagined threat. The need to retaliate can turn into violence or verbal abuse against another person. Chronic anger can lead to health problems, such as hypertension, high blood pressure, and depression.   TREATMENT   Exercising, relaxing, meditating, or writing out your feelings all can be beneficial in managing moderate anger. For unhealthy anger, the following methods may be used:   Cognitive-behavioral counseling (learning skills to change the thoughts that influence your mood).   Relaxation training.   Interpersonal counseling.   Assertive communication skills.   Medication.  Document Released: 02/02/2007 Document Revised: 06/30/2011 Document Reviewed: 06/13/2010   ExitCare Patient Information 2015 [employer]. This information is not intended to replace advice given to you by your health care provider. Make sure you discuss any questions you have with your health care provider.

## 2013-12-15 NOTE — ED Notes (Signed)
TTS complete by Harriett Sine. Probable reeval with Psychiatry in the morning.

## 2013-12-15 NOTE — [facility] Suicide Risk Assessment (Cosign Needed)
Suicide Risk Assessment  Discharge Assessment     Demographic Factors:  Male and Caucasian  Total Time spent with patient: 30 minutes  Psychiatric Specialty Exam:     Blood pressure 116/74, pulse 82, temperature 97.5 F (36.4 C), temperature source Oral, resp. rate 18, SpO2 98.00%.There is no weight on file to calculate BMI.   General Appearance: Casual   Eye Contact:: Good   Speech: Clear and Coherent and Normal Rate   Volume: Normal   Mood: Anxious   Affect: Congruent   Thought Process: Circumstantial and Goal Directed   Orientation: Full (Time, Place, and Person)   Thought Content: Rumination   Suicidal Thoughts: No   Homicidal Thoughts: No   Memory: Immediate; Good  Recent; Good  Remote; Good   Judgement: Intact   Insight: Present   Psychomotor Activity: Normal   Concentration: Good   Recall: Good   Fund of Knowledge:Good   Language: Good   Akathisia: No   Handed: Right   AIMS (if indicated):   Assets: Communication Skills  Desire for Improvement  Housing  Social Support   Sleep:   Musculoskeletal:  Strength & Muscle Tone: within normal limits  Gait & Station: normal  Patient leans: N/A   Mental Status Per Nursing Assessment::   On Admission:     Current Mental Status by Physician: Denies suicidal/homicidal, psychosis, and paranoia  Loss Factors: NA  Historical Factors: NA  Risk Reduction Factors:   Living with another person, especially a relative and Positive social support  Continued Clinical Symptoms:  None noted  Cognitive Features That Contribute To Risk:  None noted    Suicide Risk:  Minimal: No identifiable suicidal ideation.  Patients presenting with no risk factors but with morbid ruminations; may be classified as minimal risk based on the severity of the depressive symptoms  Discharge Diagnoses:  AXIS I: Mood Disorder NOS  AXIS II: Deferred  AXIS III: History reviewed. No pertinent past medical history.  AXIS IV: other  psychosocial or environmental problems  AXIS V: 61-70 mild symptoms   Plan Of Care/Follow-up recommendations:  Activity:  as tolerated Diet:  as tolerated  Is patient on multiple antipsychotic therapies at discharge:  No   Has Patient had three or more failed trials of antipsychotic monotherapy by history:  No  Recommended Plan for Multiple Antipsychotic Therapies: NA    Rankin, Shuvon, FNP-BC 12/15/2013, 11:30 AM

## 2013-12-15 NOTE — BH Assessment (Signed)
Dr. Ladona Ridgel and Denice Bors, NP recommend discharge home. Writer provided patient with a list of mental health/substance abuse outpatient referrals prior to discharge.

## 2013-12-15 NOTE — Progress Notes (Signed)
[facility] [employer],  Provided pt with a flyer for [employer] of the [place]. Patient stated that he had [employer].

## 2013-12-15 NOTE — ED Notes (Signed)
Patient discharged to home.  All discharge instructions reviewed.  Patient was given information on follow up care.  All belongings returned.  Patient left the unit ambulatory.

## 2013-12-15 NOTE — BH Assessment (Addendum)
Reviewed provider notes and requested TA equipment be placed in room for assessment.   [id number] First attempt to connect to TA equipment unsuccessful.  Clista Bernhardt, [facility] Triage Specialist 12/15/2013 4:06 AM

## 2013-12-15 NOTE — BH Assessment (Signed)
Requested a copy of IVC paperwork.   Clista Bernhardt, [facility] Triage Specialist 12/15/2013 4:35 AM
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
## 2013-12-15 NOTE — Consult Note (Signed)
Face to face evaluation and I agree with this note 

## 2013-12-15 NOTE — BH Assessment (Signed)
IVC rescinded by Dr. Taylor 
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
## 2013-12-16 NOTE — ED Provider Notes (Signed)
Medical screening examination/treatment/procedure(s) were performed by non-physician practitioner and as supervising physician I was immediately available for consultation/collaboration.   EKG Interpretation None        Purvis Sheffield, MD 12/16/13 [phone]

## 2014-06-01 ENCOUNTER — Other Ambulatory Visit: Payer: Self-pay | Admitting: Otolaryngology

## 2014-06-01 DIAGNOSIS — J329 Chronic sinusitis, unspecified: Secondary | ICD-10-CM

## 2014-06-05 ENCOUNTER — Other Ambulatory Visit: Payer: Self-pay

## 2014-06-07 ENCOUNTER — Other Ambulatory Visit: Payer: Self-pay

## 2014-06-08 ENCOUNTER — Ambulatory Visit
Admission: RE | Admit: 2014-06-08 | Discharge: 2014-06-08 | Disposition: A | Discharge status: Home / Self Care | Payer: No Typology Code available for payment source | Source: Ambulatory Visit | Attending: Otolaryngology | Admitting: Otolaryngology

## 2014-06-08 DIAGNOSIS — J329 Chronic sinusitis, unspecified: Secondary | ICD-10-CM

## 2014-09-29 ENCOUNTER — Other Ambulatory Visit: Payer: Self-pay | Admitting: Orthopedic Surgery

## 2014-09-29 DIAGNOSIS — R52 Pain, unspecified: Secondary | ICD-10-CM

## 2014-10-12 ENCOUNTER — Ambulatory Visit
Admission: RE | Admit: 2014-10-12 | Discharge: 2014-10-12 | Disposition: A | Discharge status: Home / Self Care | Payer: No Typology Code available for payment source | Source: Ambulatory Visit | Attending: Orthopedic Surgery | Admitting: Orthopedic Surgery

## 2014-10-12 DIAGNOSIS — R52 Pain, unspecified: Secondary | ICD-10-CM

## 2015-08-03 ENCOUNTER — Encounter (HOSPITAL_COMMUNITY): Payer: Self-pay | Admitting: *Deleted

## 2015-08-03 ENCOUNTER — Emergency Department (HOSPITAL_COMMUNITY)
Admission: EM | Admit: 2015-08-03 | Discharge: 2015-08-03 | Disposition: A | Discharge status: Home / Self Care | Payer: BLUE CROSS/BLUE SHIELD | Attending: Emergency Medicine | Admitting: Emergency Medicine

## 2015-08-03 DIAGNOSIS — Z79899 Other long term (current) drug therapy: Secondary | ICD-10-CM | POA: Insufficient documentation

## 2015-08-03 DIAGNOSIS — F17210 Nicotine dependence, cigarettes, uncomplicated: Secondary | ICD-10-CM | POA: Insufficient documentation

## 2015-08-03 DIAGNOSIS — M5442 Lumbago with sciatica, left side: Secondary | ICD-10-CM | POA: Insufficient documentation

## 2015-08-03 DIAGNOSIS — G8929 Other chronic pain: Secondary | ICD-10-CM | POA: Diagnosis not present

## 2015-08-03 DIAGNOSIS — M549 Dorsalgia, unspecified: Secondary | ICD-10-CM | POA: Diagnosis present

## 2015-08-03 MED ORDER — PREDNISONE 10 MG (21) PO TBPK: 10.0000 mg | ORAL_TABLET | Freq: Every day | ORAL | Status: DC

## 2015-08-03 MED ORDER — METHOCARBAMOL 500 MG PO TABS: 500.0000 mg | ORAL_TABLET | Freq: Two times a day (BID) | ORAL | Status: DC

## 2015-08-03 MED ORDER — DEXAMETHASONE SODIUM PHOSPHATE 10 MG/ML IJ SOLN
10.0000 mg | Freq: Once | INTRAMUSCULAR | Status: AC
  Administered 2015-08-03: 10 mg via INTRAMUSCULAR
  Filled 2015-08-03: qty 1

## 2015-08-03 MED ORDER — MELOXICAM 15 MG PO TABS: 15.0000 mg | ORAL_TABLET | Freq: Every day | ORAL | Status: DC

## 2015-08-03 MED ORDER — LIDOCAINE 5 % EX PTCH
1.0000 | MEDICATED_PATCH | CUTANEOUS | Status: DC
  Administered 2015-08-03: 1 via TRANSDERMAL
  Filled 2015-08-03: qty 1

## 2015-08-03 MED ORDER — KETOROLAC TROMETHAMINE 60 MG/2ML IM SOLN
60.0000 mg | Freq: Once | INTRAMUSCULAR | Status: AC
  Administered 2015-08-03: 60 mg via INTRAMUSCULAR
  Filled 2015-08-03: qty 2

## 2015-08-03 NOTE — ED Notes (Signed)
Pt is in stable condition upon d/c and ambulates from ED. 

## 2015-08-03 NOTE — ED Provider Notes (Signed)
CSN: [id number]     Arrival date & time 08/03/15  9147 History  By signing my name below, I, Linna Darner, attest that this documentation has been prepared under the direction and in the presence of non-physician practitioner, Harolyn Rutherford, PA-C. Electronically Signed: Linna Darner, Scribe. 08/03/2015. 9:03 AM.    Chief Complaint  Patient presents with  . Back Pain    The history is provided by the patient. No language interpreter was used.     HPI Comments: Alexander Caldwell is a 40 y.o. male with h/o sciatica who presents to the Emergency Department complaining of constant, severe, 10/10 pain related to sciatica beginning early this morning. Pt endorses pain from his left buttock through his left thigh and into his left knee. Pt reports that he had a flare up of sciatica two weeks ago and received a steroid shot, prednisone prescription, and muscle relaxer from his PCP, Dr. Arlyce Dice. He notes that the steroid shot alleviated his symptoms significantly; he also notes that he has been elevating his left leg as well as applying ice and heat to his painful areas with moderate relief. Pt has never seen an orthopedic surgeon nor a neurosurgeon for his symptoms. Patient states that he may have "overdone it at work." Pt denies changes in bowel or bladder control, sensory deficits, fever/chills, or any other complaints. He further denies h/o HIV, IV drug use, and h/o cancer. Pt came to the ER today because his PCP office is closed.  History reviewed. No pertinent past medical history. Past Surgical History  Procedure Laterality Date  . Anterior cruciate ligament repair Right   . Ventriculostomy     History reviewed. No pertinent family history. Social History  Substance Use Topics  . Smoking status: Current Some Day Smoker    Types: Cigarettes  . Smokeless tobacco: Never Used  . Alcohol Use: Yes     Comment: occasionally    Review of Systems  Constitutional: Negative for fever.  Genitourinary:  Negative for dysuria and difficulty urinating.  Musculoskeletal: Positive for arthralgias (left knee, left hip, left buttock).  Neurological: Negative for numbness.   Allergies  Aspirin and Hydrocodone  Home Medications   Prior to Admission medications   Medication Sig Start Date End Date Taking? Authorizing Provider  cyclobenzaprine (FLEXERIL) 5 MG tablet Take 5 mg by mouth 3 (three) times daily as needed for muscle spasms.    Historical Provider, MD  meloxicam (MOBIC) 15 MG tablet Take 1 tablet (15 mg total) by mouth daily. 08/03/15   Shawn C Joy, PA-C  methocarbamol (ROBAXIN) 500 MG tablet Take 1 tablet (500 mg total) by mouth 2 (two) times daily. 08/03/15   Shawn C Joy, PA-C  montelukast (SINGULAIR) 10 MG tablet Take 10 mg by mouth daily as needed (for allergies).    Historical Provider, MD  multivitamin-iron-minerals-folic acid (CENTRUM) chewable tablet Chew 1 tablet by mouth daily.    Historical Provider, MD  Omega-3 Fatty Acids (FISH OIL PO) Take 1 capsule by mouth 3 (three) times daily.    Historical Provider, MD  predniSONE (STERAPRED UNI-PAK 21 TAB) 10 MG (21) TBPK tablet Take 1 tablet (10 mg total) by mouth daily. Take 6 tabs by mouth daily  for 2 days, then 5 tabs for 2 days, then 4 tabs for 2 days, then 3 tabs for 2 days, 2 tabs for 2 days, then 1 tab by mouth daily for 2 days 08/03/15   Shawn C Joy, PA-C   BP 116/72 mmHg  Pulse 82  Temp(Src) 98.5 F (36.9 C) (Oral)  Resp 20  Ht 6' (1.829 m)  Wt 94.348 kg  BMI 28.20 kg/m2  SpO2 95% Physical Exam  Constitutional: He appears well-developed and well-nourished. No distress.  HENT:  Head: Normocephalic and atraumatic.  Eyes: Conjunctivae are normal.  Cardiovascular: Normal rate, regular rhythm and intact distal pulses.   Pulmonary/Chest: Effort normal.  Musculoskeletal:  Tenderness to the left lower lumbar and sacral musculature. Full ROM in all extremities and spine. No paraspinal tenderness.   Neurological: He is alert. He  has normal reflexes.  No sensory deficits. Strength 5/5 in all extremities. No gait disturbance. Coordination intact.   Skin: Skin is warm and dry. He is not diaphoretic.  Nursing note and vitals reviewed.   ED Course  Procedures (including critical care time)  DIAGNOSTIC STUDIES: Oxygen Saturation is 98% on RA, normal by my interpretation.    COORDINATION OF CARE: 9:03 AM Discussed treatment plan with pt at bedside and pt agreed to plan.  Medications  dexamethasone (DECADRON) injection 10 mg (10 mg Intramuscular Given 08/03/15 0926)  ketorolac (TORADOL) injection 60 mg (60 mg Intramuscular Given 08/03/15 0927)    MDM   Final diagnoses:  Left-sided low back pain with left-sided sciatica    Alexander Caldwell presents with acute on chronic left lower back pain radiating down the left leg.  Patient's presentation and physical exam is consistent with symptoms of sciatica. Patient has no neuro or functional deficits. No red flag symptoms. Patient specifically requested that no narcotics be prescribed. Patient states he can take NSAIDs without difficulty, despite his documented aspirin allergy. Patient improved with conservative management here in the ED. Patient to follow-up with orthopedics should symptoms continue. Home care and return precautions discussed. Patient voiced understanding of these instructions and is comfortable with discharge.  I personally performed the services described in this documentation, which was scribed in my presence. The recorded information has been reviewed and is accurate.   Anselm PancoastShawn C Joy, PA-C 08/03/15 1719  Loren Raceravid Yelverton, MD 08/07/15 1655

## 2015-08-03 NOTE — ED Notes (Signed)
Pt reports recent diagnosis of sciatica. Having severe pain to lower back and into left buttock, hip and upper leg. Reports left knee has been giving out on him recently. No acute distress noted at triage.

## 2015-08-03 NOTE — Discharge Instructions (Signed)
You have been seen today for sciatica pain. He should follow-up with orthopedics should symptoms continue. Call the number provided to set up an appointment. Follow up with PCP as needed. Return to ED should symptoms worsen or you have serious symptoms, such as those listed on the attached literature.

## 2015-08-08 ENCOUNTER — Other Ambulatory Visit: Payer: Self-pay | Admitting: Family Medicine

## 2015-08-08 DIAGNOSIS — M5440 Lumbago with sciatica, unspecified side: Secondary | ICD-10-CM

## 2015-08-12 ENCOUNTER — Ambulatory Visit
Admission: RE | Admit: 2015-08-12 | Discharge: 2015-08-12 | Disposition: A | Discharge status: Home / Self Care | Payer: BLUE CROSS/BLUE SHIELD | Source: Ambulatory Visit | Attending: Family Medicine | Admitting: Family Medicine

## 2015-08-12 DIAGNOSIS — M5440 Lumbago with sciatica, unspecified side: Secondary | ICD-10-CM

## 2016-12-16 ENCOUNTER — Ambulatory Visit
Admission: RE | Admit: 2016-12-16 | Discharge: 2016-12-16 | Disposition: A | Discharge status: Home / Self Care | Payer: No Typology Code available for payment source | Source: Ambulatory Visit | Attending: Family Medicine | Admitting: Family Medicine

## 2016-12-16 ENCOUNTER — Other Ambulatory Visit (HOSPITAL_COMMUNITY): Payer: Self-pay | Admitting: Psychiatry

## 2016-12-16 ENCOUNTER — Other Ambulatory Visit: Payer: Self-pay | Admitting: Family Medicine

## 2016-12-16 DIAGNOSIS — S59901A Unspecified injury of right elbow, initial encounter: Secondary | ICD-10-CM

## 2017-04-12 ENCOUNTER — Emergency Department (HOSPITAL_COMMUNITY)
Admission: EM | Admit: 2017-04-12 | Discharge: 2017-04-12 | Disposition: A | Discharge status: Home / Self Care | Payer: Managed Care, Other (non HMO) | Attending: Emergency Medicine | Admitting: Emergency Medicine

## 2017-04-12 ENCOUNTER — Encounter (HOSPITAL_COMMUNITY): Payer: Self-pay | Admitting: Emergency Medicine

## 2017-04-12 ENCOUNTER — Other Ambulatory Visit: Payer: Self-pay

## 2017-04-12 ENCOUNTER — Emergency Department (HOSPITAL_COMMUNITY): Payer: Managed Care, Other (non HMO)

## 2017-04-12 DIAGNOSIS — F17210 Nicotine dependence, cigarettes, uncomplicated: Secondary | ICD-10-CM | POA: Insufficient documentation

## 2017-04-12 DIAGNOSIS — Y939 Activity, unspecified: Secondary | ICD-10-CM | POA: Insufficient documentation

## 2017-04-12 DIAGNOSIS — S02642A Fracture of ramus of left mandible, initial encounter for closed fracture: Secondary | ICD-10-CM | POA: Insufficient documentation

## 2017-04-12 DIAGNOSIS — Y999 Unspecified external cause status: Secondary | ICD-10-CM | POA: Insufficient documentation

## 2017-04-12 DIAGNOSIS — Z79899 Other long term (current) drug therapy: Secondary | ICD-10-CM | POA: Insufficient documentation

## 2017-04-12 DIAGNOSIS — F10929 Alcohol use, unspecified with intoxication, unspecified: Secondary | ICD-10-CM

## 2017-04-12 DIAGNOSIS — Y929 Unspecified place or not applicable: Secondary | ICD-10-CM | POA: Insufficient documentation

## 2017-04-12 DIAGNOSIS — S022XXA Fracture of nasal bones, initial encounter for closed fracture: Secondary | ICD-10-CM | POA: Insufficient documentation

## 2017-04-12 MED ORDER — OXYCODONE-ACETAMINOPHEN 5-325 MG PO TABS: 2.0000 | ORAL_TABLET | Freq: Four times a day (QID) | ORAL | 0 refills | Status: DC | PRN

## 2017-04-12 MED ORDER — FENTANYL CITRATE (PF) 100 MCG/2ML IJ SOLN
50.0000 ug | Freq: Once | INTRAMUSCULAR | Status: AC
  Administered 2017-04-12: 50 ug via INTRAVENOUS
  Filled 2017-04-12: qty 2

## 2017-04-12 MED ORDER — AMOXICILLIN-POT CLAVULANATE 875-125 MG PO TABS: 1.0000 | ORAL_TABLET | Freq: Two times a day (BID) | ORAL | 0 refills | Status: DC
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
## 2017-04-12 NOTE — ED Notes (Signed)
Patient ambulated with supervision in hallway. Gait steady. Patient denies dizziness or feeling light headed.
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
## 2017-04-12 NOTE — ED Notes (Signed)
Patient discharged to home with father. Discharge instructions reviewed. Patient/father verbalize understanding.
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
## 2017-06-11 ENCOUNTER — Other Ambulatory Visit: Payer: Self-pay | Admitting: Orthopedic Surgery

## 2017-06-12 ENCOUNTER — Encounter (HOSPITAL_COMMUNITY): Payer: Self-pay | Admitting: *Deleted

## 2017-06-12 ENCOUNTER — Other Ambulatory Visit: Payer: Self-pay

## 2017-06-12 NOTE — Progress Notes (Signed)
Pt denies SOB, chest pain, and being under the care of a cardiologist. Pt denies having a stress test, echo and cardiac cath. Pt denies having an EKG and chest x ray within the last year. Pt made aware to stop taking vitamins, CBG oil, fish oil and herbal medications. Do not take any NSAIDs ie: Ibuprofen, Advil, Naproxen (Aleve), Motrin, BC and Goody Powder. Pt verbalized understanding of all pre-op instructions.

## 2017-06-13 MED ORDER — VANCOMYCIN HCL 10 G IV SOLR
1500.0000 mg | INTRAVENOUS | Status: AC
  Administered 2017-06-15: 1500 mg via INTRAVENOUS
  Filled 2017-06-13: qty 1500

## 2017-06-15 ENCOUNTER — Encounter (HOSPITAL_COMMUNITY)
Admission: RE | Disposition: A | Discharge status: Home Health | Payer: Self-pay | Source: Ambulatory Visit | Attending: Orthopedic Surgery

## 2017-06-15 ENCOUNTER — Inpatient Hospital Stay (HOSPITAL_COMMUNITY): Payer: No Typology Code available for payment source | Admitting: Anesthesiology

## 2017-06-15 ENCOUNTER — Encounter (HOSPITAL_COMMUNITY): Payer: Self-pay | Admitting: Certified Registered Nurse Anesthetist

## 2017-06-15 ENCOUNTER — Inpatient Hospital Stay (HOSPITAL_COMMUNITY)
Admission: RE | Admit: 2017-06-15 | Discharge: 2017-06-18 | DRG: 857 | Disposition: A | Discharge status: Home Health | Payer: No Typology Code available for payment source | Source: Ambulatory Visit | Attending: Orthopedic Surgery | Admitting: Orthopedic Surgery

## 2017-06-15 DIAGNOSIS — M009 Pyogenic arthritis, unspecified: Secondary | ICD-10-CM | POA: Diagnosis present

## 2017-06-15 DIAGNOSIS — Z79899 Other long term (current) drug therapy: Secondary | ICD-10-CM

## 2017-06-15 DIAGNOSIS — Y838 Other surgical procedures as the cause of abnormal reaction of the patient, or of later complication, without mention of misadventure at the time of the procedure: Secondary | ICD-10-CM | POA: Diagnosis present

## 2017-06-15 DIAGNOSIS — Z87891 Personal history of nicotine dependence: Secondary | ICD-10-CM

## 2017-06-15 DIAGNOSIS — M869 Osteomyelitis, unspecified: Secondary | ICD-10-CM | POA: Diagnosis present

## 2017-06-15 DIAGNOSIS — X58XXXA Exposure to other specified factors, initial encounter: Secondary | ICD-10-CM | POA: Diagnosis present

## 2017-06-15 DIAGNOSIS — Y813 Surgical instruments, materials and general- and plastic-surgery devices (including sutures) associated with adverse incidents: Secondary | ICD-10-CM | POA: Diagnosis present

## 2017-06-15 DIAGNOSIS — T85622A Displacement of permanent sutures, initial encounter: Secondary | ICD-10-CM | POA: Diagnosis present

## 2017-06-15 DIAGNOSIS — Z882 Allergy status to sulfonamides status: Secondary | ICD-10-CM

## 2017-06-15 DIAGNOSIS — Z885 Allergy status to narcotic agent status: Secondary | ICD-10-CM | POA: Diagnosis not present

## 2017-06-15 DIAGNOSIS — B9561 Methicillin susceptible Staphylococcus aureus infection as the cause of diseases classified elsewhere: Secondary | ICD-10-CM | POA: Diagnosis present

## 2017-06-15 DIAGNOSIS — S59902A Unspecified injury of left elbow, initial encounter: Secondary | ICD-10-CM | POA: Diagnosis present

## 2017-06-15 DIAGNOSIS — M25421 Effusion, right elbow: Secondary | ICD-10-CM | POA: Diagnosis present

## 2017-06-15 DIAGNOSIS — A4901 Methicillin susceptible Staphylococcus aureus infection, unspecified site: Secondary | ICD-10-CM

## 2017-06-15 DIAGNOSIS — R7401 Elevation of levels of liver transaminase levels: Secondary | ICD-10-CM

## 2017-06-15 DIAGNOSIS — T8149XA Infection following a procedure, other surgical site, initial encounter: Secondary | ICD-10-CM | POA: Diagnosis present

## 2017-06-15 DIAGNOSIS — M25521 Pain in right elbow: Secondary | ICD-10-CM | POA: Diagnosis present

## 2017-06-15 DIAGNOSIS — R740 Nonspecific elevation of levels of transaminase and lactic acid dehydrogenase [LDH]: Secondary | ICD-10-CM

## 2017-06-15 DIAGNOSIS — A499 Bacterial infection, unspecified: Secondary | ICD-10-CM

## 2017-06-15 DIAGNOSIS — Z87828 Personal history of other (healed) physical injury and trauma: Secondary | ICD-10-CM

## 2017-06-15 HISTORY — DX: Nausea with vomiting, unspecified: R11.2

## 2017-06-15 HISTORY — PX: INCISION AND DRAINAGE: SHX5863

## 2017-06-15 HISTORY — DX: Other injury of unspecified body region, initial encounter: T14.8XXA

## 2017-06-15 HISTORY — DX: Other specified postprocedural states: Z98.890

## 2017-06-15 HISTORY — DX: Local infection of the skin and subcutaneous tissue, unspecified: L08.9

## 2017-06-15 HISTORY — DX: Other specified injuries of abdomen, initial encounter: S39.81XA

## 2017-06-15 HISTORY — DX: Gastro-esophageal reflux disease without esophagitis: K21.9

## 2017-06-15 HISTORY — DX: Abnormal levels of other serum enzymes: R74.8

## 2017-06-15 HISTORY — DX: Unspecified osteoarthritis, unspecified site: M19.90

## 2017-06-15 HISTORY — PX: ELBOW ARTHROSCOPY: SHX614

## 2017-06-15 LAB — COMPREHENSIVE METABOLIC PANEL
ALT: 180 U/L — ABNORMAL HIGH (ref 17–63)
AST: 116 U/L — AB (ref 15–41)
Albumin: 3.9 g/dL (ref 3.5–5.0)
Alkaline Phosphatase: 70 U/L (ref 38–126)
Anion gap: 12 (ref 5–15)
BUN: <5 mg/dL — ABNORMAL LOW (ref 6–20)
CHLORIDE: 103 mmol/L (ref 101–111)
CO2: 21 mmol/L — ABNORMAL LOW (ref 22–32)
Calcium: 9.5 mg/dL (ref 8.9–10.3)
Creatinine, Ser: 0.74 mg/dL (ref 0.61–1.24)
Glucose, Bld: 92 mg/dL (ref 65–99)
POTASSIUM: 3.6 mmol/L (ref 3.5–5.1)
Sodium: 136 mmol/L (ref 135–145)
Total Bilirubin: 0.9 mg/dL (ref 0.3–1.2)
Total Protein: 8.2 g/dL — ABNORMAL HIGH (ref 6.5–8.1)

## 2017-06-15 LAB — CBC WITH DIFFERENTIAL/PLATELET
BASOS ABS: 0.1 10*3/uL (ref 0.0–0.1)
Basophils Relative: 1 %
EOS PCT: 6 %
Eosinophils Absolute: 0.6 10*3/uL (ref 0.0–0.7)
HCT: 46.2 % (ref 39.0–52.0)
Hemoglobin: 16.6 g/dL (ref 13.0–17.0)
LYMPHS PCT: 35 %
Lymphs Abs: 3.8 10*3/uL (ref 0.7–4.0)
MCH: 31.5 pg (ref 26.0–34.0)
MCHC: 35.9 g/dL (ref 30.0–36.0)
MCV: 87.7 fL (ref 78.0–100.0)
MONO ABS: 0.7 10*3/uL (ref 0.1–1.0)
Monocytes Relative: 7 %
Neutro Abs: 5.6 10*3/uL (ref 1.7–7.7)
Neutrophils Relative %: 51 %
PLATELETS: 224 10*3/uL (ref 150–400)
RBC: 5.27 MIL/uL (ref 4.22–5.81)
RDW: 12.8 % (ref 11.5–15.5)
WBC: 10.8 10*3/uL — ABNORMAL HIGH (ref 4.0–10.5)

## 2017-06-15 LAB — URINALYSIS, ROUTINE W REFLEX MICROSCOPIC
Bilirubin Urine: NEGATIVE
Glucose, UA: NEGATIVE mg/dL
Hgb urine dipstick: NEGATIVE
Ketones, ur: NEGATIVE mg/dL
LEUKOCYTES UA: NEGATIVE
NITRITE: NEGATIVE
Protein, ur: NEGATIVE mg/dL
SPECIFIC GRAVITY, URINE: 1.013 (ref 1.005–1.030)
pH: 8 (ref 5.0–8.0)

## 2017-06-15 LAB — C-REACTIVE PROTEIN: CRP: <0.8 mg/dL (ref <1.0)

## 2017-06-15 LAB — SEDIMENTATION RATE: SED RATE: 1 mm/h (ref 0–16)

## 2017-06-15 SURGERY — INCISION AND DRAINAGE
Anesthesia: General | Laterality: Right

## 2017-06-15 MED ORDER — OXYCODONE HCL 5 MG PO TABS
ORAL_TABLET | ORAL | Status: AC
  Administered 2017-06-15: 10 mg via ORAL
  Filled 2017-06-15: qty 2

## 2017-06-15 MED ORDER — METHOCARBAMOL 500 MG PO TABS
500.0000 mg | ORAL_TABLET | Freq: Four times a day (QID) | ORAL | Status: DC | PRN
  Administered 2017-06-15 – 2017-06-17 (×3): 500 mg via ORAL
  Filled 2017-06-15 (×3): qty 1

## 2017-06-15 MED ORDER — ROCURONIUM BROMIDE 100 MG/10ML IV SOLN
INTRAVENOUS | Status: DC | PRN
  Administered 2017-06-15: 50 mg via INTRAVENOUS

## 2017-06-15 MED ORDER — ONDANSETRON HCL 4 MG PO TABS: 4.0000 mg | ORAL_TABLET | Freq: Four times a day (QID) | ORAL | Status: DC | PRN

## 2017-06-15 MED ORDER — ONDANSETRON HCL 4 MG/2ML IJ SOLN: 4.0000 mg | Freq: Once | INTRAMUSCULAR | Status: DC | PRN

## 2017-06-15 MED ORDER — SUGAMMADEX SODIUM 200 MG/2ML IV SOLN
INTRAVENOUS | Status: DC | PRN
  Administered 2017-06-15: 300 mg via INTRAVENOUS

## 2017-06-15 MED ORDER — HYDROMORPHONE HCL 1 MG/ML IJ SOLN
0.5000 mg | INTRAMUSCULAR | Status: DC | PRN
  Administered 2017-06-16 – 2017-06-17 (×5): 1 mg via INTRAVENOUS
  Filled 2017-06-15 (×5): qty 1

## 2017-06-15 MED ORDER — METHOCARBAMOL 1000 MG/10ML IJ SOLN
500.0000 mg | Freq: Four times a day (QID) | INTRAVENOUS | Status: DC | PRN
  Filled 2017-06-15: qty 5

## 2017-06-15 MED ORDER — MIDAZOLAM HCL 2 MG/2ML IJ SOLN
INTRAMUSCULAR | Status: AC
  Filled 2017-06-15: qty 2

## 2017-06-15 MED ORDER — FENTANYL CITRATE (PF) 100 MCG/2ML IJ SOLN
INTRAMUSCULAR | Status: AC
  Administered 2017-06-15: 50 ug via INTRAVENOUS
  Filled 2017-06-15: qty 2

## 2017-06-15 MED ORDER — FENTANYL CITRATE (PF) 100 MCG/2ML IJ SOLN
25.0000 ug | INTRAMUSCULAR | Status: DC | PRN
  Administered 2017-06-15 (×2): 50 ug via INTRAVENOUS

## 2017-06-15 MED ORDER — SODIUM CHLORIDE 0.9 % IV SOLN
INTRAVENOUS | Status: DC
  Administered 2017-06-15: 23:00:00 via INTRAVENOUS

## 2017-06-15 MED ORDER — FENTANYL CITRATE (PF) 250 MCG/5ML IJ SOLN
INTRAMUSCULAR | Status: AC
  Filled 2017-06-15: qty 5

## 2017-06-15 MED ORDER — FENTANYL CITRATE (PF) 100 MCG/2ML IJ SOLN
INTRAMUSCULAR | Status: DC | PRN
  Administered 2017-06-15: 100 ug via INTRAVENOUS
  Administered 2017-06-15: 50 ug via INTRAVENOUS
  Administered 2017-06-15: 100 ug via INTRAVENOUS

## 2017-06-15 MED ORDER — ACETAMINOPHEN 325 MG PO TABS: 650.0000 mg | ORAL_TABLET | ORAL | Status: DC | PRN

## 2017-06-15 MED ORDER — PROPOFOL 10 MG/ML IV BOLUS
INTRAVENOUS | Status: DC | PRN
  Administered 2017-06-15: 150 mg via INTRAVENOUS

## 2017-06-15 MED ORDER — LIDOCAINE HCL (CARDIAC) 20 MG/ML IV SOLN
INTRAVENOUS | Status: DC | PRN
  Administered 2017-06-15: 40 mg via INTRAVENOUS

## 2017-06-15 MED ORDER — LACTATED RINGERS IV SOLN
Freq: Once | INTRAVENOUS | Status: AC
  Administered 2017-06-15: 13:00:00 via INTRAVENOUS

## 2017-06-15 MED ORDER — LIDOCAINE 2% (20 MG/ML) 5 ML SYRINGE
INTRAMUSCULAR | Status: AC
  Filled 2017-06-15: qty 5

## 2017-06-15 MED ORDER — METHOCARBAMOL 500 MG PO TABS
ORAL_TABLET | ORAL | Status: AC
  Administered 2017-06-15: 500 mg via ORAL
  Filled 2017-06-15: qty 1

## 2017-06-15 MED ORDER — DIAZEPAM 5 MG PO TABS
5.0000 mg | ORAL_TABLET | Freq: Two times a day (BID) | ORAL | Status: DC
  Administered 2017-06-15: 10 mg via ORAL
  Administered 2017-06-16: 5 mg via ORAL
  Administered 2017-06-16 – 2017-06-18 (×4): 10 mg via ORAL
  Filled 2017-06-15 (×2): qty 2
  Filled 2017-06-15: qty 1
  Filled 2017-06-15 (×3): qty 2

## 2017-06-15 MED ORDER — CHLORHEXIDINE GLUCONATE 4 % EX LIQD: 60.0000 mL | Freq: Once | CUTANEOUS | Status: DC

## 2017-06-15 MED ORDER — LACTATED RINGERS IV SOLN
INTRAVENOUS | Status: DC | PRN
  Administered 2017-06-15 (×2): via INTRAVENOUS

## 2017-06-15 MED ORDER — OXYCODONE-ACETAMINOPHEN 5-325 MG PO TABS: 1.0000 | ORAL_TABLET | Freq: Four times a day (QID) | ORAL | 0 refills | Status: AC | PRN

## 2017-06-15 MED ORDER — DIAZEPAM 10 MG PO TABS: 10.0000 mg | ORAL_TABLET | Freq: Two times a day (BID) | ORAL | 0 refills | Status: AC

## 2017-06-15 MED ORDER — ACETAMINOPHEN 650 MG RE SUPP: 650.0000 mg | RECTAL | Status: DC | PRN

## 2017-06-15 MED ORDER — OXYCODONE HCL 5 MG PO TABS
5.0000 mg | ORAL_TABLET | ORAL | Status: DC | PRN
  Administered 2017-06-15 – 2017-06-18 (×10): 10 mg via ORAL
  Filled 2017-06-15 (×9): qty 2

## 2017-06-15 MED ORDER — MIDAZOLAM HCL 5 MG/5ML IJ SOLN
INTRAMUSCULAR | Status: DC | PRN
  Administered 2017-06-15: 2 mg via INTRAVENOUS

## 2017-06-15 MED ORDER — VANCOMYCIN HCL IN DEXTROSE 1-5 GM/200ML-% IV SOLN
1000.0000 mg | Freq: Three times a day (TID) | INTRAVENOUS | Status: DC
  Administered 2017-06-15 – 2017-06-17 (×6): 1000 mg via INTRAVENOUS
  Filled 2017-06-15 (×6): qty 200

## 2017-06-15 MED ORDER — SODIUM CHLORIDE 0.9 % IR SOLN
Status: DC | PRN
  Administered 2017-06-15 (×2): 3000 mL

## 2017-06-15 MED ORDER — SODIUM CHLORIDE 0.9 % IV SOLN
2.0000 g | INTRAVENOUS | Status: DC
  Administered 2017-06-15: 2 g via INTRAVENOUS
  Filled 2017-06-15: qty 20

## 2017-06-15 MED ORDER — ONDANSETRON HCL 4 MG/2ML IJ SOLN: 4.0000 mg | Freq: Four times a day (QID) | INTRAMUSCULAR | Status: DC | PRN

## 2017-06-15 SURGICAL SUPPLY — 54 items
BANDAGE ACE 4X5 VEL STRL LF (GAUZE/BANDAGES/DRESSINGS) ×3 IMPLANT
BLADE CUTTER GATOR 3.5 (BLADE) ×3 IMPLANT
BLADE GREAT WHITE 4.2 (BLADE) ×2 IMPLANT
BLADE GREAT WHITE 4.2MM (BLADE) ×1
BNDG COHESIVE 6X5 TAN STRL LF (GAUZE/BANDAGES/DRESSINGS) ×3 IMPLANT
CONT SPECI 4OZ STER CLIK (MISCELLANEOUS) ×3 IMPLANT
COVER SURGICAL LIGHT HANDLE (MISCELLANEOUS) ×3 IMPLANT
CUFF TOURNIQUET SINGLE 18IN (TOURNIQUET CUFF) ×3 IMPLANT
DRAPE ARTHROSCOPY W/POUCH 114 (DRAPES) ×3 IMPLANT
DRAPE HALF SHEET 40X57 (DRAPES) ×3 IMPLANT
DRAPE ORTHO SPLIT 77X108 STRL (DRAPES) ×2
DRAPE SURG ORHT 6 SPLT 77X108 (DRAPES) ×1 IMPLANT
DRAPE U-SHAPE 47X51 STRL (DRAPES) ×3 IMPLANT
DRSG EMULSION OIL 3X3 NADH (GAUZE/BANDAGES/DRESSINGS) ×3 IMPLANT
DRSG PAD ABDOMINAL 8X10 ST (GAUZE/BANDAGES/DRESSINGS) ×3 IMPLANT
DURAPREP 26ML APPLICATOR (WOUND CARE) ×3 IMPLANT
GAUZE SPONGE 4X4 12PLY STRL (GAUZE/BANDAGES/DRESSINGS) ×3 IMPLANT
GAUZE SPONGE 4X4 12PLY STRL LF (GAUZE/BANDAGES/DRESSINGS) ×3 IMPLANT
GAUZE XEROFORM 5X9 LF (GAUZE/BANDAGES/DRESSINGS) ×3 IMPLANT
GLOVE BIOGEL PI IND STRL 8 (GLOVE) ×1 IMPLANT
GLOVE BIOGEL PI INDICATOR 8 (GLOVE) ×2
GLOVE ECLIPSE 7.5 STRL STRAW (GLOVE) ×3 IMPLANT
GOWN STRL REUS W/ TWL LRG LVL3 (GOWN DISPOSABLE) ×2 IMPLANT
GOWN STRL REUS W/ TWL XL LVL3 (GOWN DISPOSABLE) ×2 IMPLANT
GOWN STRL REUS W/TWL LRG LVL3 (GOWN DISPOSABLE) ×4
GOWN STRL REUS W/TWL XL LVL3 (GOWN DISPOSABLE) ×4
HANDPIECE INTERPULSE COAX TIP (DISPOSABLE) ×2
KIT BASIN OR (CUSTOM PROCEDURE TRAY) ×3 IMPLANT
KIT ROOM TURNOVER OR (KITS) ×3 IMPLANT
MANIFOLD NEPTUNE II (INSTRUMENTS) ×3 IMPLANT
NEEDLE 18GX1X1/2 (RX/OR ONLY) (NEEDLE) ×3 IMPLANT
PACK ARTHROSCOPY DSU (CUSTOM PROCEDURE TRAY) ×3 IMPLANT
PAD ABD 8X10 STRL (GAUZE/BANDAGES/DRESSINGS) ×3 IMPLANT
PAD ARMBOARD 7.5X6 YLW CONV (MISCELLANEOUS) ×6 IMPLANT
PAD CAST 4YDX4 CTTN HI CHSV (CAST SUPPLIES) ×1 IMPLANT
PADDING CAST COTTON 4X4 STRL (CAST SUPPLIES) ×2
SET ARTHROSCOPY TUBING (MISCELLANEOUS) ×2
SET ARTHROSCOPY TUBING LN (MISCELLANEOUS) ×1 IMPLANT
SET HNDPC FAN SPRY TIP SCT (DISPOSABLE) ×1 IMPLANT
SLING ARM FOAM STRAP LRG (SOFTGOODS) ×3 IMPLANT
SPLINT FIBERGLASS 4X15 (CAST SUPPLIES) ×3 IMPLANT
SUT ETHILON 2 0 PSLX (SUTURE) ×3 IMPLANT
SUT ETHILON 4 0 PS 2 18 (SUTURE) IMPLANT
SUT VIC AB 0 CT1 27 (SUTURE) ×2
SUT VIC AB 0 CT1 27XBRD ANBCTR (SUTURE) ×1 IMPLANT
SWAB COLLECTION DEVICE MRSA (MISCELLANEOUS) ×3 IMPLANT
SWAB CULTURE LIQ STUART DBL (MISCELLANEOUS) ×3 IMPLANT
SYR 20CC LL (SYRINGE) ×3 IMPLANT
SYR CONTROL 10ML LL (SYRINGE) ×6 IMPLANT
TOWEL OR 17X24 6PK STRL BLUE (TOWEL DISPOSABLE) ×6 IMPLANT
TUBE CONNECTING 12'X1/4 (SUCTIONS) ×2
TUBE CONNECTING 12X1/4 (SUCTIONS) ×4 IMPLANT
WAND HAND CNTRL MULTIVAC 90 (MISCELLANEOUS) IMPLANT
WATER STERILE IRR 1000ML POUR (IV SOLUTION) ×3 IMPLANT

## 2017-06-15 NOTE — Transfer of Care (Signed)
Immediate Anesthesia Transfer of Care Note  Patient: Alexander Caldwell  Procedure(s) Performed: INCISION AND DRAINAGE RIGHT ELBOW WOUND, WITH SUTURE REMOVAL. (Right ) ARTHROSCOPY ELBOW WITH DEBRIDEMENT (Right )  Patient Location: PACU  Anesthesia Type:General  Level of Consciousness: awake, alert  and oriented  Airway & Oxygen Therapy: Patient Spontanous Breathing and Patient connected to nasal cannula oxygen  Post-op Assessment: Report given to RN and Post -op Vital signs reviewed and stable  Post vital signs: Reviewed and stable  Last Vitals:  Vitals:   06/15/17 1742 06/15/17 1743  BP: (!) 145/99   Pulse: 94   Resp:    Temp:  36.6 C  SpO2: 94%     Last Pain:  Vitals:   06/15/17 1743  TempSrc:   PainSc: Asleep         Complications: No apparent anesthesia complications

## 2017-06-15 NOTE — Brief Op Note (Signed)
06/15/2017  5:20 PM  PATIENT:  Aleatha BorerKery M Metheny  42 y.o. male  PRE-OPERATIVE DIAGNOSIS:  INFECTED RIGHT WOUND WITH POSSIBLE ELBOW INFECTION  POST-OPERATIVE DIAGNOSIS:  NFECTED RIGHT WOUND WITH POSSIBLE ELBOW INFECTION  PROCEDURE:  Procedure(s): INCISION AND DRAINAGE RIGHT ELBOW WOUND, WITH SUTURE REMOVAL. (Right) ARTHROSCOPY ELBOW WITH DEBRIDEMENT (Right)  SURGEON:  Surgeon(s) and Role:    Jodi Geralds* Amyre Segundo, MD - Primary  PHYSICIAN ASSISTANT:   ASSISTANTS: bethune    ANESTHESIA:   general  EBL:  minimal   BLOOD ADMINISTERED:Admit to inpatient   DRAINS: none   LOCAL MEDICATIONS USED:  NONE  SPECIMEN:  Source of Specimen:  elbow joint and posterior elbow  DISPOSITION OF SPECIMEN:  micro  COUNTS:  YES  TOURNIQUET:  * Missing tourniquet times found for documented tourniquets in log: [id number] *  DICTATION: .Other Dictation: Dictation Number [id number]  PLAN OF CARE: Admit to inpatient   PATIENT DISPOSITION:  PACU - hemodynamically stable.   Delay start of Pharmacological VTE agent (>24hrs) due to surgical blood loss or risk of bleeding: no

## 2017-06-15 NOTE — Progress Notes (Signed)
Pharmacy Antibiotic Note  Aleatha BorerKery M Mancusi is a 42 y.o. male admitted on 06/15/2017 with R elbow pain, and drainage, s/p I&D, Pharmacy has been consulted for vancomycin and rocephin dosing, empirically to cover osteomyelitis. Noted pt received 1500 mg vancomycin 1500 mg at 1300, prior to surgery. Est. crcl > 100 ml/min   Plan: Vancomycin 1g IV Q 8 hrs.  Rocephin 2g IV Q 24 hrs F/u synovial fluid culture Monitor renal function vancomycin trough after 3-5 doses  Height: 6' (182.9 cm) Weight: 225 lb (102.1 kg) IBW/kg (Calculated) : 77.6  Temp (24hrs), Avg:97.9 F (36.6 C), Min:97.5 F (36.4 C), Max:98.2 F (36.8 C)  Recent Labs  Lab 06/15/17 1226  WBC 10.8*  CREATININE 0.74    Estimated Creatinine Clearance: 153.3 mL/min (by C-G formula based on SCr of 0.74 mg/dL).    Allergies  Allergen Reactions  . Aspirin Swelling  . Hydrocodone Itching    Antimicrobials this admission: Vancomycin 2/25 Rocephin 2/25  Dose adjustments this admission:   Microbiology results: 2/25 synovial fluid  Thank you for allowing pharmacy to be a part of this patient's care.  Bayard HuggerMei Shann Lewellyn, PharmD, BCPS  Clinical Pharmacist  Pager: [phone]   06/15/2017 8:45 PM

## 2017-06-15 NOTE — Anesthesia Procedure Notes (Signed)
Procedure Name: Intubation Date/Time: 06/15/2017 3:41 PM Performed by: Shaquel Josephson T, CRNA Pre-anesthesia Checklist: Patient identified, Emergency Drugs available, Suction available and Patient being monitored Patient Re-evaluated:Patient Re-evaluated prior to induction Oxygen Delivery Method: Circle system utilized Preoxygenation: Pre-oxygenation with 100% oxygen Induction Type: IV induction Ventilation: Mask ventilation without difficulty and Oral airway inserted - appropriate to patient size Laryngoscope Size: Miller and 3 Grade View: Grade I Tube type: Oral Tube size: 7.5 mm Number of attempts: 1 Airway Equipment and Method: Patient positioned with wedge pillow and Stylet Placement Confirmation: ETT inserted through vocal cords under direct vision,  positive ETCO2 and breath sounds checked- equal and bilateral Secured at: 22 cm Tube secured with: Tape Dental Injury: Teeth and Oropharynx as per pre-operative assessment

## 2017-06-15 NOTE — Anesthesia Preprocedure Evaluation (Signed)
Anesthesia Evaluation  Patient identified by MRN, date of birth, ID band Patient awake    Reviewed: Allergy & Precautions, NPO status , Patient's Chart, lab work & pertinent test results  Airway Mallampati: II  TM Distance: >3 FB     Dental  (+) Dental Advisory Given, Teeth Intact   Pulmonary former smoker,    breath sounds clear to auscultation       Cardiovascular  Rhythm:Regular Rate:Normal     Neuro/Psych    GI/Hepatic   Endo/Other    Renal/GU      Musculoskeletal   Abdominal   Peds  Hematology   Anesthesia Other Findings   Reproductive/Obstetrics                             Anesthesia Physical Anesthesia Plan  ASA: II  Anesthesia Plan: General   Post-op Pain Management:    Induction: Intravenous  PONV Risk Score and Plan: Ondansetron and Dexamethasone  Airway Management Planned: Oral ETT  Additional Equipment:   Intra-op Plan:   Post-operative Plan: Extubation in OR  Informed Consent: I have reviewed the patients History and Physical, chart, labs and discussed the procedure including the risks, benefits and alternatives for the proposed anesthesia with the patient or authorized representative who has indicated his/her understanding and acceptance.   Dental advisory given  Plan Discussed with: CRNA and Anesthesiologist  Anesthesia Plan Comments:         Anesthesia Quick Evaluation

## 2017-06-15 NOTE — H&P (Signed)
PREOPERATIVE H&P  Chief Complaint: Right elbow pain  HPI: Alexander Caldwell is a 42 y.o. male who presents for evaluation of right elbow pain and drainage. It has been present for several months and has been worsening. He has failed conservative measures. Pain is rated as mild.  Past Medical History:  Diagnosis Date  . Arthritis   . Elevated liver enzymes    PMH  . GERD (gastroesophageal reflux disease)    PMH  . PONV (postoperative nausea and vomiting)   . Sports hernia   . Wound infection     right elbow   Past Surgical History:  Procedure Laterality Date  . ANTERIOR CRUCIATE LIGAMENT REPAIR Right   . BACK SURGERY    . ELBOW FRACTURE SURGERY    . FINGER SURGERY     tendon repair left index finger  . KNEE ARTHROSCOPY WITH MENISCAL REPAIR     left  . RHINOPLASTY    . VENTRICULOSTOMY    . WISDOM TOOTH EXTRACTION     Social History   Socioeconomic History  . Marital status: Single    Spouse name: None  . Number of children: None  . Years of education: None  . Highest education level: None  Social Needs  . Financial resource strain: None  . Food insecurity - worry: None  . Food insecurity - inability: None  . Transportation needs - medical: None  . Transportation needs - non-medical: None  Occupational History  . None  Tobacco Use  . Smoking status: Former Smoker    Types: Cigarettes  . Smokeless tobacco: Never Used  . Tobacco comment: last use 2017  Substance and Sexual Activity  . Alcohol use: Yes    Comment: occasionally beer  . Drug use: Yes    Types: Marijuana    Comment: last use 2018; current CBD oil use  . Sexual activity: None  Other Topics Concern  . None  Social History Narrative  . None   History reviewed. No pertinent family history. Allergies  Allergen Reactions  . Aspirin Swelling  . Hydrocodone Itching   Prior to Admission medications   Medication Sig Start Date End Date Taking? Authorizing Provider  diazepam (VALIUM) 10 MG tablet Take  5-10 mg by mouth 2 (two) times daily.   Yes [provider]  HYDROcodone-acetaminophen (NORCO/VICODIN) 5-325 MG tablet Take 1 tablet by mouth every 4 (four) hours as needed for moderate pain.   Yes [provider]  multivitamin-iron-minerals-folic acid (CENTRUM) chewable tablet Chew 1 tablet by mouth daily.   Yes [provider]     Positive ROS: None  All other systems have been reviewed and were otherwise negative with the exception of those mentioned in the HPI and as above.  Physical Exam: Vitals:   06/15/17 1225  BP: (!) 134/95  Pulse: 83  Resp: 20  Temp: 98.2 F (36.8 C)  SpO2: 99%    General: Alert, no acute distress Cardiovascular: No pedal edema Respiratory: No cyanosis, no use of accessory musculature GI: No organomegaly, abdomen is soft and non-tender Skin: No lesions in the area of chief complaint Neurologic: Sensation intact distally Psychiatric: Patient is competent for consent with normal mood and affect Lymphatic: No axillary or cervical lymphadenopathy  MUSCULOSKELETAL: Right elbow: Mild pain with range of motion.  Mild drainage and irritation of the skin on the posterior aspect of the right elbow.  MRI: MRI shows significant fluid in the elbow joint with obvious irritation and inflammation in the triceps region.  Assessment/Plan: INFECTED RIGHT WOUND WITH POSSIBLE ELBOW INFECTION Plan for Procedure(s): INCISION AND DRAINAGE RIGHT ELBOW WOUND, WITH SUTURE REMOVAL. ARTHROSCOPY ELBOW WITH DEBRIDEMENT  The risks benefits and alternatives were discussed with the patient including but not limited to the risks of nonoperative treatment, versus surgical intervention including infection, bleeding, nerve injury, malunion, nonunion, hardware prominence, hardware failure, need for hardware removal, blood clots, cardiopulmonary complications, morbidity, mortality, among others, and they were willing to proceed.  Predicted outcome is good,  although there will be at least a six to nine month expected recovery.  Alexander JuniorJohn L Arrin Ishler, MD 06/15/2017 3:17 PM

## 2017-06-16 ENCOUNTER — Inpatient Hospital Stay: Payer: Self-pay

## 2017-06-16 ENCOUNTER — Encounter (HOSPITAL_COMMUNITY): Payer: Self-pay | Admitting: Orthopedic Surgery

## 2017-06-16 DIAGNOSIS — Z885 Allergy status to narcotic agent status: Secondary | ICD-10-CM

## 2017-06-16 DIAGNOSIS — M8648 Chronic osteomyelitis with draining sinus, other site: Secondary | ICD-10-CM

## 2017-06-16 DIAGNOSIS — K469 Unspecified abdominal hernia without obstruction or gangrene: Secondary | ICD-10-CM

## 2017-06-16 DIAGNOSIS — Z87891 Personal history of nicotine dependence: Secondary | ICD-10-CM

## 2017-06-16 DIAGNOSIS — Z87828 Personal history of other (healed) physical injury and trauma: Secondary | ICD-10-CM

## 2017-06-16 DIAGNOSIS — Z9889 Other specified postprocedural states: Secondary | ICD-10-CM

## 2017-06-16 DIAGNOSIS — Z792 Long term (current) use of antibiotics: Secondary | ICD-10-CM

## 2017-06-16 DIAGNOSIS — B9561 Methicillin susceptible Staphylococcus aureus infection as the cause of diseases classified elsewhere: Secondary | ICD-10-CM

## 2017-06-16 DIAGNOSIS — K219 Gastro-esophageal reflux disease without esophagitis: Secondary | ICD-10-CM

## 2017-06-16 DIAGNOSIS — R7989 Other specified abnormal findings of blood chemistry: Secondary | ICD-10-CM

## 2017-06-16 DIAGNOSIS — Z886 Allergy status to analgesic agent status: Secondary | ICD-10-CM

## 2017-06-16 DIAGNOSIS — M1990 Unspecified osteoarthritis, unspecified site: Secondary | ICD-10-CM

## 2017-06-16 MED ORDER — SODIUM CHLORIDE 0.9% FLUSH: 10.0000 mL | INTRAVENOUS | Status: DC | PRN

## 2017-06-16 MED ORDER — SODIUM CHLORIDE 0.9% FLUSH
10.0000 mL | Freq: Two times a day (BID) | INTRAVENOUS | Status: DC
  Administered 2017-06-17: 10 mL

## 2017-06-16 MED ORDER — CEFAZOLIN SODIUM-DEXTROSE 2-4 GM/100ML-% IV SOLN
2.0000 g | Freq: Three times a day (TID) | INTRAVENOUS | Status: DC
Start: 2017-06-16 — End: 2017-06-17
  Administered 2017-06-16 – 2017-06-17 (×3): 2 g via INTRAVENOUS
  Filled 2017-06-16 (×3): qty 100

## 2017-06-16 NOTE — Progress Notes (Signed)
Peripherally Inserted Central Catheter/Midline Placement  The IV Nurse has discussed with the patient and/or persons authorized to consent for the patient, the purpose of this procedure and the potential benefits and risks involved with this procedure.  The benefits include less needle sticks, lab draws from the catheter, and the patient may be discharged home with the catheter. Risks include, but not limited to, infection, bleeding, blood clot (thrombus formation), and puncture of an artery; nerve damage and irregular heartbeat and possibility to perform a PICC exchange if needed/ordered by physician.  Alternatives to this procedure were also discussed.  Bard Power PICC patient education guide, fact sheet on infection prevention and patient information card has been provided to patient /or left at bedside.    PICC/Midline Placement Documentation  PICC Single Lumen 06/16/17 PICC Left Basilic 51 cm 0 cm (Active)  Indication for Insertion or Continuance of Line Home intravenous therapies (PICC only) 06/16/2017  8:49 PM  Exposed Catheter (cm) 0 cm 06/16/2017  8:49 PM  Site Assessment Clean;Dry;Intact 06/16/2017  8:49 PM  Line Status Flushed;Saline locked;Blood return noted 06/16/2017  8:49 PM  Dressing Type Transparent 06/16/2017  8:49 PM  Dressing Status Clean;Dry;Intact 06/16/2017  8:49 PM  Dressing Change Due 06/23/17 06/16/2017  8:49 PM       Ethelda Chickurrie, Dechelle Attaway Robert 06/16/2017, 8:50 PM

## 2017-06-16 NOTE — Progress Notes (Signed)
Subjective: 1 Day Post-Op Procedure(s) (LRB): INCISION AND DRAINAGE RIGHT ELBOW WOUND, WITH SUTURE REMOVAL. (Right) ARTHROSCOPY ELBOW WITH DEBRIDEMENT (Right) Patient reports pain as mild.  No complaints.  Patient's mother at bedside.  Objective: Vital signs in last 24 hours: Temp:  [97.5 F (36.4 C)-98.5 F (36.9 C)] 98.5 F (36.9 C) (02/26 0811) Pulse Rate:  [65-94] 77 (02/26 0811) Resp:  [6-20] 16 (02/26 0614) BP: (111-145)/(66-99) 124/84 (02/26 0811) SpO2:  [92 %-100 %] 100 % (02/26 0811) Weight:  [102.1 kg (225 lb)] 102.1 kg (225 lb) (02/25 1225)  Intake/Output from previous day: 02/25 0701 - 02/26 0700 In: 1000 [I.V.:1000] Out: 100 [Blood:100] Intake/Output this shift: No intake/output data recorded.  Recent Labs    06/15/17 1226  HGB 16.6   Recent Labs    06/15/17 1226  WBC 10.8*  RBC 5.27  HCT 46.2  PLT 224   Recent Labs    06/15/17 1226  NA 136  K 3.6  CL 103  CO2 21*  BUN <5*  CREATININE 0.74  GLUCOSE 92  CALCIUM 9.5  Gram stain right elbow shows rare gram-positive cocci.  Cultures pending. CRP yesterday 0.8.  Sed rate yesterday 1 mm WBC yesterday 10.8 K. Right upper extremity exam: Posterior splint intact.  No evidence of drainage of any kind.  Moves fingers actively.  Assessment/Plan: 1 Day Post-Op Procedure(s) (LRB): INCISION AND DRAINAGE RIGHT ELBOW WOUND, WITH SUTURE REMOVAL. (Right) ARTHROSCOPY ELBOW WITH DEBRIDEMENT (Right) Plan: Infectious disease consult today. Dr. Luiz BlareGraves spoke with them yesterday.  They recommended IV Rocephin and vancomycin. Will await culture results as well as recommendations for antibiotics and duration of therapy from ID.  We appreciate their assistance.  Quoc Tome G 06/16/2017, 8:16 AM
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
## 2017-06-16 NOTE — Op Note (Signed)
NAME:  Alexander Caldwell, Alexander Caldwell                       ACCOUNT NO.:  MEDICAL RECORD NO.:  [id number]  LOCATION:                                 FACILITY:  PHYSICIAN:  Harvie JuniorJohn L. Johntavius Shepard, M.D.   DATE OF BIRTH:  May 27, 1975  DATE OF PROCEDURE:  06/15/2017 DATE OF DISCHARGE:                              OPERATIVE REPORT   PREOPERATIVE DIAGNOSES: 1. Infected right triceps tendon repair wound posteriorly in the     elbow. 2. Effusion with concern for infection, right elbow. 3. Retained suture from previous triceps tendon repair.  PROCEDURES: 1. Elbow arthroscopy with debridement and synovectomy, right elbow. 2. Excisional debridement of skin, subcutaneous tissue, muscle,     fascia, and bone at the site of a triceps tendon repair. 3. Removal of multiple deep foreign bodies.  SURGEON:  Harvie JuniorJohn L. Anahli Arvanitis, MD.  ASSISOrma Flaming:  [place].  ANESTHESIA:  General.  BRIEF HISTORY:  Mr. 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  He was ultimately taken to the operating room where he had the triceps tendon elevated, explored and then taken down.  He ultimately underwent triceps tendon repair through bone tunnels.  He initially did well and pain had come under good control.  Then, he started doing poorly.  He started having open drainage from the back of his wound.  We put him on antibiotics initially, ultimately cultured this drainage and it grew out Staph.  We felt that it was still just a simple wound problem and so we treated him multiple times.  Every time we stopped treating him with antibiotics, he would start draining again and ultimately we just felt like we needed to go back and do some sort of formal debridement.  We had an MRI done.  The MRI showed a significant effusion and I spent some time with the radiologist.  It did not appear as though we had infected elbow joint, but certainly had a big effusion and there  was one area on the radial head that was problematic.  We felt like we were not certain what was going on here. We aspirated that fluid.  It came back with a high number of neutrophils, but not an absolute neutrophil count and the culture was negative and he unfortunately was on antibiotics at that point.  I talked to Infectious Disease about the case.  They felt like the right thing to do was to take him off antibiotics.  We did that for 10 days and then brought into the operating room for elbow arthroscopy with debridement and debridement of the posterior wound.  He was brought to the operating room for this procedure.  DESCRIPTION OF PROCEDURE:  The patient was brought to the operating room and after adequate anesthesia was obtained with general anesthetic the patient was placed supine on the operating table.  He was then moved in the left lateral decubitus position and all bony prominences were well padded.  Attention was then turned to the right elbow, which was hanging over a __________.  At this point, we made an anterior approach  to the elbow joint and put a camera in, made a separate anterior portal from the lateral side and got cross viewing.  The articular cartilage looked pretty good.  We did find this one area on the radial head and we did also make a posterior portal in the soft spot of the elbow.  The debridement of the joint was taken thoroughly with 3 L of normal saline irrigation.  There really did not appear to be extensive signs of infection or other abnormality and we debrided the elbow joint thoroughly at this point.  We did find one small area of articular cartilage damage on the radial head, which was outlined on MRI and we debrided this area.  We then kept our instruments in the elbow joint and went posteriorly.  We elliptically incised the entire previous wound including all draining sinuses and took the skin and subcutaneous tissue off the table.  We then did a  debridement of the bursa on the posterior aspect of the triceps, released the skin up onto the triceps and then identified the sutures that had previously been there to repair the triceps.  There were two FiberWire sutures, which unfortunately had failed and they failed directly through bone, so we were able to track the bony tunnel down, which really became more of a bony slot in the olecranon and we traced that right down and we were able to curette that out and cleaned that out real good.  We then insufflated the elbow joint dramatically to see if there was any communication and we could not get a direct communication from the triceps to the front of the elbow, although obviously there was some concern that there could be communication there.  We did curette out the bony area and cleaned it up as well.  Then once we got this cleaned up very nicely, we went ahead and closed this area.  So, we did an excisional debridement of skin, subcutaneous tissue, muscle, fascia, and bone at the site of this infection and then we did a removal of the FiberWire sutures which was essentially a foreign body.  Once this was done and it had been irrigated thoroughly, we took cultures initially.  We took cultures initially of fluid from the elbow joint, but in this aspiration there really was very minimal fluid in the elbow joint, maybe less than a mL.  We sent it to the lab.  I doubt that they are going to be able to get cell counts, but they are going to be able to get a Gram stain and culture off that, that would be the elbow joint fluid.  We then separately cultured what appeared to be pus and some obvious infectious material back in the area of the triceps tendon and curetted out the bone back in this area as well.  We sent that material also for Gram stain and culture, so that would be a separate culture from the posterior aspect of the wound.  At this point, we closed these layers and put a  sterile compressive dressing on him, and we will admit him to the hospital and consult Infectious Disease for thoughts on potential treatment.     [name], M.D.     Ranae Plumber  D:  06/15/2017  T:  06/15/2017  Job:  [id number]

## 2017-06-16 NOTE — Progress Notes (Signed)
[facility]  [facility] will follow pt with ID and Ortho teams to support home IV ABX at DC if ordered.  If patient discharges after hours, please call [phone].   Sedalia Mutaamela S Chandler 06/16/2017, 8:47 AM

## 2017-06-17 DIAGNOSIS — Z95828 Presence of other vascular implants and grafts: Secondary | ICD-10-CM

## 2017-06-17 DIAGNOSIS — A4901 Methicillin susceptible Staphylococcus aureus infection, unspecified site: Secondary | ICD-10-CM

## 2017-06-17 DIAGNOSIS — F1099 Alcohol use, unspecified with unspecified alcohol-induced disorder: Secondary | ICD-10-CM

## 2017-06-17 DIAGNOSIS — M00021 Staphylococcal arthritis, right elbow: Secondary | ICD-10-CM

## 2017-06-17 DIAGNOSIS — Z87828 Personal history of other (healed) physical injury and trauma: Secondary | ICD-10-CM

## 2017-06-17 DIAGNOSIS — R740 Nonspecific elevation of levels of transaminase and lactic acid dehydrogenase [LDH]: Secondary | ICD-10-CM

## 2017-06-17 DIAGNOSIS — T8459XA Infection and inflammatory reaction due to other internal joint prosthesis, initial encounter: Secondary | ICD-10-CM

## 2017-06-17 DIAGNOSIS — R7401 Elevation of levels of liver transaminase levels: Secondary | ICD-10-CM

## 2017-06-17 DIAGNOSIS — A499 Bacterial infection, unspecified: Secondary | ICD-10-CM

## 2017-06-17 MED ORDER — SODIUM CHLORIDE 0.9 % IV SOLN: 820.0000 mg | INTRAVENOUS | 0 refills | Status: DC

## 2017-06-17 MED ORDER — DAPTOMYCIN IV (FOR PTA / DISCHARGE USE ONLY): 820.0000 mg | INTRAVENOUS | 0 refills | Status: DC

## 2017-06-17 MED ORDER — SODIUM CHLORIDE 0.9 % IV SOLN
820.0000 mg | INTRAVENOUS | Status: DC
  Administered 2017-06-17: 820 mg via INTRAVENOUS
  Filled 2017-06-17 (×2): qty 16.4

## 2017-06-17 NOTE — Progress Notes (Signed)
Patient has discharge orders, but stated that he was notified that the cultures were still pending and they still didn't know the correct antibiotic he would need. Patient also stated he was told by [facility] that they would not be able to get any antibiotics for him until tomorrow. Attempted calling Gus PumaJim Bethune, PA x2 and attempted to call [facility] after hours with no answer.
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
## 2017-06-17 NOTE — Progress Notes (Signed)
Subjective: 2 Days Post-Op Procedure(s) (LRB): INCISION AND DRAINAGE RIGHT ELBOW WOUND, WITH SUTURE REMOVAL. (Right) ARTHROSCOPY ELBOW WITH DEBRIDEMENT (Right) Patient reports pain as mild.  PICC line placed last night.  Objective: Vital signs in last 24 hours: Temp:  [98.2 F (36.8 C)-98.5 F (36.9 C)] 98.5 F (36.9 C) (02/27 0421) Pulse Rate:  [74-81] 74 (02/27 0421) Resp:  [12-14] 14 (02/27 0421) BP: (118-141)/(74-80) 136/79 (02/27 0421) SpO2:  [98 %-99 %] 98 % (02/27 0421)  Intake/Output from previous day: 02/26 0701 - 02/27 0700 In: 800 [P.O.:800] Out: -  Intake/Output this shift: No intake/output data recorded.  Recent Labs    06/15/17 1226  HGB 16.6   Recent Labs    06/15/17 1226  WBC 10.8*  RBC 5.27  HCT 46.2  PLT 224   Recent Labs    06/15/17 1226  NA 136  K 3.6  CL 103  CO2 21*  BUN <5*  CREATININE 0.74  GLUCOSE 92  CALCIUM 9.5  Right elbow culture  Positive for staph aureus. Sensitivities pending No results for input(s): LABPT, INR in the last 72 hours. Right elbow exam: Post splnt intact. Move s fingers actively. Splint clean and dry   Assessment/Plan: 2 Days Post-Op Procedure(s) (LRB): INCISION AND DRAINAGE RIGHT ELBOW WOUND, WITH SUTURE REMOVAL. (Right) ARTHROSCOPY ELBOW WITH DEBRIDEMENT (Right)  Infection Right elbow Plan: OK to dc home with IV antibiotics once sensitivities completed.  Antibiotic recommendations per ID. We appreciate their help.  Makilah Dowda G 06/17/2017, 11:30 AM

## 2017-06-17 NOTE — Progress Notes (Addendum)
[place] for Infectious Disease  Date of Admission:  06/15/2017     Total days of antibiotics 3  Vancomycin   Ancef          Patient ID: Alexander Caldwell is a 42 y.o. M with early osteomyelitis/septic arthritis of the right elbow joint in setting of retained metal sutures. He is POD 2 I&D with removal of both retained sutures.  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 diazepam  5-10 mg Oral BID  . sodium chloride flush  10-40 mL Intracatheter Q12H    SUBJECTIVE: Feeling well today. Reports his pain is much improved compared to prior to surgery. He has no fevers or chills. PICC is in place. He is worried about who will help him with IV infusions as he is limited with right elbow surgery. He lives alone but mother is close by and available to help with once daily administration.   Allergies  Allergen Reactions  . Aspirin Swelling  . Hydrocodone Itching    OBJECTIVE: Vitals:   06/16/17 0811 06/16/17 1400 06/16/17 2001 06/17/17 0421  BP: 124/84 118/74 (!) 141/80 136/79  Pulse: 77 75 81 74  Resp: 14 12 14 14   Temp: 98.5 F (36.9 C)  98.2 F (36.8 C) 98.5 F (36.9 C)  TempSrc: Oral  Oral Oral  SpO2: 100% 98% 99% 98%  Weight:      Height:       Body mass index is 30.52 kg/m.  Physical Exam  Constitutional: He is oriented to person, place, and time and well-developed, well-nourished, and in no distress.  HENT:  Mouth/Throat: Oropharynx is clear and moist. No oral lesions. Normal dentition. No dental caries.  Eyes: No scleral icterus.  Cardiovascular: Normal rate, regular rhythm and normal heart sounds.  Pulmonary/Chest: Effort normal and breath sounds normal.  Abdominal: Soft. He exhibits no distension. There is no tenderness.  Musculoskeletal: He exhibits no edema.  LUE PICC In place clean and dry. Dressing coming loose. Elbow dressing clean and dry.   Lymphadenopathy:    He has no  cervical adenopathy.  Neurological: He is alert and oriented to person, place, and time.  Skin: Skin is warm and dry. No rash noted.  Psychiatric: Mood and affect normal.  Vitals reviewed.   Lab Results Lab Results  Component Value Date   WBC 10.8 (H) 06/15/2017   HGB 16.6 06/15/2017   HCT 46.2 06/15/2017   MCV 87.7 06/15/2017   PLT 224 06/15/2017    Lab Results  Component Value Date   CREATININE 0.74 06/15/2017   BUN <5 (L) 06/15/2017   NA 136 06/15/2017   K 3.6 06/15/2017   CL 103 06/15/2017   CO2 21 (L) 06/15/2017    Lab Results  Component Value Date   ALT 180 (H) 06/15/2017   AST 116 (H) 06/15/2017   ALKPHOS 70 06/15/2017   BILITOT 0.9 06/15/2017     Microbiology: Right Elbow Synovium 2/25 > Staph with sensi's pending    ASSESSMENT: 41 y.o. M with staph aureus septic arthritis / early osteomyelitis of the radial head infection involving his right elbow. He is now POD 2 I&D. Pain is controlled. He has PICC line in place. He lives alone but does report his mother could assist with support if once a day administration is possible.   PLAN: Staph Aureus septic arthritis =  PICC in  place.  Discussion with Carolynn Sayers regarding options for once daily home administration. If sensitive could consider continuous Nafcillin infusion which would be a once a day maintenance; alternatively could consider daptomycin which would cover resistant or sensitive staph aures and no continuous line. Will see what is the more cost-effective option considering his insurance and arrange for first dose inpatient.  OK to discharge home once we know more about insurance.  Plan will be 6 weeks with end date 07/28/17.    Medication Monitoring =  to be determined what lab work is needed based on which medication is chosen for treatment.   Elevated LFTs =  likely chronic as he drinks a fair amount of alcohol. Will need to monitor if he is on a cephalosporin. Outpatient follow up recommended with  primary team.   Janene Madeira, MSN, NP-C [place] [facility] for Infectious [place] Cell: [phone] Pager: [phone]  06/17/2017  11:28 AM     ADDENDUM: Patient is self pay - father is going to take care of medical bills. Will place orders for daptomycin 62m/kg/d for ease of once a day infusion with the following orders for outpatient therapy:   OPAT ORDERS:  Diagnosis: Septic arthritis / Osteomyelitis of Right Elbow  Culture Result: staphylococcus aureus (sensi's pending)   Allergies  Allergen Reactions  . Aspirin Swelling  . Hydrocodone Itching    Discharge antibiotics: Daptomycin 8 mg/kg/day   Duration: 6 weeks   End Date: 07/28/2017  [facility] Per Protocol _x_ Please pull PIC at completion of IV antibiotics  Labs weekly while on IV antibiotics: _x_ CBC with differential _x_ BMP _x_ CRP _x_ ESR _x_ CK  Fax weekly labs to ([phone] Clinic Follow Up Appt: Dr. SBaxter Flatteryin 4-5 weeks on March 27th 3:15 pm   SJanene Madeira MSN, NP-C [facility] DCrossvilleCell: [phone]: [phone] 06/17/2017  2:29 PM

## 2017-06-17 NOTE — Progress Notes (Signed)
PHARMACY CONSULT NOTE FOR:  OUTPATIENT  PARENTERAL ANTIBIOTIC THERAPY (OPAT)  Indication: osteomyelitis Regimen: Daptomycin 820mg  (8mg /kg) IV q24h End date: 07/28/2017  IV antibiotic discharge orders are pended. To discharging provider:  please sign these orders via discharge navigator,  Select New Orders & click on the button choice - Manage This Unsigned Work.     Thank you for allowing pharmacy to be a part of this patient's care.  Jeb Schloemer 06/17/2017, 2:31 PM

## 2017-06-18 LAB — BODY FLUID CULTURE

## 2017-06-18 MED ORDER — SODIUM CHLORIDE 0.9 % IV SOLN
2.0000 g | INTRAVENOUS | Status: DC
  Administered 2017-06-18: 2 g via INTRAVENOUS
  Filled 2017-06-18 (×6): qty 2000

## 2017-06-18 MED ORDER — NAFCILLIN IV (FOR PTA / DISCHARGE USE ONLY): 12.0000 g | INTRAVENOUS | 0 refills | Status: DC

## 2017-06-18 NOTE — Anesthesia Postprocedure Evaluation (Signed)
Anesthesia Post Note  Patient: Alexander Caldwell  Procedure(s) Performed: INCISION AND DRAINAGE RIGHT ELBOW WOUND, WITH SUTURE REMOVAL. (Right ) ARTHROSCOPY ELBOW WITH DEBRIDEMENT (Right )     Patient location during evaluation: PACU Anesthesia Type: General Level of consciousness: awake and alert Pain management: pain level controlled Vital Signs Assessment: post-procedure vital signs reviewed and stable Respiratory status: spontaneous breathing, nonlabored ventilation, respiratory function stable and patient connected to nasal cannula oxygen Cardiovascular status: blood pressure returned to baseline and stable Postop Assessment: no apparent nausea or vomiting Anesthetic complications: no    Last Vitals:  Vitals:   06/17/17 2200 06/18/17 0516  BP: (!) 150/86 (!) 134/57  Pulse: 80 (!) 58  Resp:  17  Temp:  36.5 C  SpO2:  95%    Last Pain:  Vitals:   06/18/17 1026  TempSrc:   PainSc: 5                  Peter Keyworth COKER

## 2017-06-18 NOTE — Progress Notes (Signed)
Subjective: 3 Days Post-Op Procedure(s) (LRB): INCISION AND DRAINAGE RIGHT ELBOW WOUND, WITH SUTURE REMOVAL. (Right) ARTHROSCOPY ELBOW WITH DEBRIDEMENT (Right) Patient reports pain as moderate.    Objective: Vital signs in last 24 hours: Temp:  [97.7 F (36.5 C)-98 F (36.7 C)] 97.7 F (36.5 C) (02/28 0516) Pulse Rate:  [58-82] 58 (02/28 0516) Resp:  [17-82] 17 (02/28 0516) BP: (134-168)/(56-86) 134/57 (02/28 0516) SpO2:  [95 %-98 %] 95 % (02/28 0516)  Intake/Output from previous day: 02/27 0701 - 02/28 0700 In: 520 [P.O.:520] Out: 0  Intake/Output this shift: No intake/output data recorded.  Recent Labs    06/15/17 1226  HGB 16.6   Recent Labs    06/15/17 1226  WBC 10.8*  RBC 5.27  HCT 46.2  PLT 224   Recent Labs    06/15/17 1226  NA 136  K 3.6  CL 103  CO2 21*  BUN <5*  CREATININE 0.74  GLUCOSE 92  CALCIUM 9.5   No results for input(s): LABPT, INR in the last 72 hours.  Neurologically intact ABD soft Neurovascular intact Sensation intact distally Compartment soft  Assessment/Plan: 3 Days Post-Op Procedure(s) (LRB): INCISION AND DRAINAGE RIGHT ELBOW WOUND, WITH SUTURE REMOVAL. (Right) ARTHROSCOPY ELBOW WITH DEBRIDEMENT (Right) Discharge home with home health  Alexander Caldwell 06/18/2017, 7:59 AM

## 2017-06-18 NOTE — Progress Notes (Signed)
PHARMACY CONSULT NOTE FOR:  OUTPATIENT  PARENTERAL ANTIBIOTIC THERAPY (OPAT)  Indication: osteomyelitis Regimen: Nafcillin 12g IV continuous infusion End date: 07/28/2017  IV antibiotic discharge orders are pended. To discharging provider:  please sign these orders via discharge navigator,  Select New Orders & click on the button choice - Manage This Unsigned Work.     Thank you for allowing pharmacy to be a part of this patient's care.  Armandina StammerBATCHELDER,Dominyck Reser J 06/18/2017, 3:25 PM

## 2017-06-18 NOTE — Care Management Note (Signed)
Case Management Note  Patient Details  Name: Aleatha BorerKery M Behringer MRN: [id number] Date of Birth: [birth year]  Subjective/Objective:   42 yr old male s/p I&D of right elbow wound, with suture removal, and arthroscopy of elbow.                 Action/Plan: Patient will need to be discharged on IV antibiotics. PICC has been placed. [facility] is working with patient to provide Home Health RN and antibiotics.    Expected Discharge Date:  06/18/17               Expected Discharge Plan:  Home w Home Health Services  In-House Referral:  NA  Discharge planning Services  CM Consult  Post Acute Care Choice:  Home Health Choice offered to:  Patient  DME Arranged:  IV pump/equipment DME Agency:  [facility]  HH Arranged:  RN, IV Antibiotics HH Agency:  [facility]  Status of Service:  Completed, signed off  If discussed at Long Length of Stay Meetings, dates discussed:    Additional Comments:  Durenda GuthrieBrady, Kamsiyochukwu Buist Naomi, RN 06/18/2017, 11:45 AM

## 2017-06-18 NOTE — Progress Notes (Signed)
Pt d/c home per MD order, pt VSS, pt AVS printed this am, ABT medication changes were made and pt unwilling to wait for new d/c AVS, On-Call MD notified and stated ok with making change of ABT on current AVS and to d/c patient home. Current AVS given to patient and updated. All questions answered, pt verbalized understanding.

## 2017-06-18 NOTE — Progress Notes (Signed)
Called to discuss what final word on sensitivities are from OR sample growing Staph Aureus - will need to be re-run and will not be available until tomorrow (Friday).   Dapto co-pay is not an option for Alexander Caldwell with private pay rate ($720/d). Nafcillin is much cheaper and once a day continuous infusion maintenance. Will need to wait for final report to confirm.   Alexander Madeira, MSN, NP-C [facility] for Infectious Disease [facility] Cell: [phone] Pager: [phone]  06/18/2017  8:57 am    ADDENDUM:  MSSA (R-doxy) on cultures. Will do 2 gm dose Nafcillin x 1 now and plan for discharge home on continuous infusion 12g Q24h.   OPAT ORDERS:  Diagnosis: Early osteomyelitis/septic arthritis of right elbow  Culture Result: MSSA (R-tetracycline)   Allergies  Allergen Reactions  . Aspirin Swelling  . Hydrocodone Itching    Discharge antibiotics: Nafcillin 12g Q 24h IV (continuous infusion)   Duration: 4 weeks   End Date: 07/28/2017  [facility] Care and Maintenance Per Protocol _x_ Please pull PIC at completion of IV antibiotics  Labs weekly while on IV antibiotics: _x_ CBC with differential _x_ BMP _x_ CRP _x_ ESR  Fax weekly labs to [phone]  Clinic Follow Up Appt: Dr. Baxter Flattery in 4-5 weeks on March 27th 3:15 pm   Alexander Madeira, MSN, NP-C [facility] for Infectious [place] Cell: [phone] Pager: [phone]  06/18/2017  3:23 PM

## 2017-06-19 LAB — BODY FLUID CULTURE: Culture: NO GROWTH

## 2017-06-20 LAB — ANAEROBIC CULTURE

## 2017-06-26 ENCOUNTER — Other Ambulatory Visit: Payer: Self-pay | Admitting: Pharmacist

## 2017-06-26 NOTE — Progress Notes (Signed)
11

## 2017-07-03 ENCOUNTER — Other Ambulatory Visit: Payer: Self-pay | Admitting: Pharmacist

## 2017-07-07 ENCOUNTER — Encounter: Payer: Self-pay | Admitting: *Deleted

## 2017-07-15 ENCOUNTER — Ambulatory Visit (INDEPENDENT_AMBULATORY_CARE_PROVIDER_SITE_OTHER): Payer: Self-pay | Admitting: Internal Medicine

## 2017-07-15 ENCOUNTER — Encounter: Payer: Self-pay | Admitting: Internal Medicine

## 2017-07-15 ENCOUNTER — Telehealth: Payer: Self-pay

## 2017-07-15 VITALS — BP 147/91 | HR 70 | Temp 98.2°F | Wt 225.0 lb

## 2017-07-15 DIAGNOSIS — A4901 Methicillin susceptible Staphylococcus aureus infection, unspecified site: Secondary | ICD-10-CM

## 2017-07-15 DIAGNOSIS — M86121 Other acute osteomyelitis, right humerus: Secondary | ICD-10-CM
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
## 2017-07-15 NOTE — Telephone Encounter (Signed)
Per Dr. Drue SecondSnider called [facility] to inform them to continue treating pt with Iv antibiotics until April 9th and pull picc after last dose is given. Spoke with Eunice BlaseDebbie who was able to take this order and update the orders to have the pt's picc pulled after last dose. Stated I/ someone from the office would call if the orders needed to be changed.  Lorenso CourierJose L Maldonado, [place]MA

## 2017-07-17 ENCOUNTER — Other Ambulatory Visit: Payer: Self-pay | Admitting: Pharmacist

## 2017-08-25 ENCOUNTER — Encounter: Payer: Self-pay | Admitting: Internal Medicine

## 2017-08-25 ENCOUNTER — Ambulatory Visit (INDEPENDENT_AMBULATORY_CARE_PROVIDER_SITE_OTHER): Payer: Self-pay | Admitting: Internal Medicine

## 2017-08-25 VITALS — BP 126/93 | HR 102 | Temp 97.6°F | Ht 71.0 in | Wt 234.0 lb

## 2017-08-25 DIAGNOSIS — M86121 Other acute osteomyelitis, right humerus: Secondary | ICD-10-CM

## 2017-08-25 DIAGNOSIS — A4901 Methicillin susceptible Staphylococcus aureus infection, unspecified site: Secondary | ICD-10-CM
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
## 2018-03-13 ENCOUNTER — Other Ambulatory Visit: Payer: Self-pay

## 2018-03-13 ENCOUNTER — Encounter (HOSPITAL_COMMUNITY): Payer: Self-pay | Admitting: Emergency Medicine

## 2018-03-13 ENCOUNTER — Emergency Department (HOSPITAL_COMMUNITY): Payer: No Typology Code available for payment source

## 2018-03-13 ENCOUNTER — Emergency Department (HOSPITAL_COMMUNITY)
Admission: EM | Admit: 2018-03-13 | Discharge: 2018-03-13 | Disposition: A | Discharge status: Home / Self Care | Payer: No Typology Code available for payment source | Attending: Emergency Medicine | Admitting: Emergency Medicine

## 2018-03-13 DIAGNOSIS — W228XXA Striking against or struck by other objects, initial encounter: Secondary | ICD-10-CM | POA: Insufficient documentation

## 2018-03-13 DIAGNOSIS — S99922A Unspecified injury of left foot, initial encounter: Secondary | ICD-10-CM | POA: Diagnosis present

## 2018-03-13 DIAGNOSIS — S9032XA Contusion of left foot, initial encounter: Secondary | ICD-10-CM | POA: Diagnosis not present

## 2018-03-13 DIAGNOSIS — Y9289 Other specified places as the place of occurrence of the external cause: Secondary | ICD-10-CM | POA: Diagnosis not present

## 2018-03-13 DIAGNOSIS — Y9389 Activity, other specified: Secondary | ICD-10-CM | POA: Insufficient documentation

## 2018-03-13 DIAGNOSIS — Z87891 Personal history of nicotine dependence: Secondary | ICD-10-CM | POA: Insufficient documentation

## 2018-03-13 DIAGNOSIS — Y998 Other external cause status: Secondary | ICD-10-CM | POA: Diagnosis not present

## 2018-03-13 MED ORDER — ACETAMINOPHEN 500 MG PO TABS
1000.0000 mg | ORAL_TABLET | Freq: Once | ORAL | Status: AC
  Administered 2018-03-13: 1000 mg via ORAL
  Filled 2018-03-13: qty 2

## 2018-03-13 MED ORDER — IBUPROFEN 800 MG PO TABS
800.0000 mg | ORAL_TABLET | Freq: Once | ORAL | Status: AC
  Administered 2018-03-13: 800 mg via ORAL
  Filled 2018-03-13: qty 1
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
## 2018-03-13 NOTE — Discharge Instructions (Addendum)
You may use over-the-counter Motrin (Ibuprofen), Acetaminophen (Tylenol). Apply ice for the next 2-4 days.
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
## 2018-03-13 NOTE — ED Notes (Signed)
Bed: WA23 Expected date:  Expected time:  Means of arrival:  Comments: 

## 2019-11-06 IMAGING — CR DG FOOT COMPLETE 3+V*L*
3 series · 3 of 3 positions shown · non-contrast
Comparison: Left ankle 09/30/2007

CLINICAL DATA: Left foot injury with knot on top of the foot.
Numbness to the toes.

EXAM:
LEFT FOOT - COMPLETE 3+ VIEW

[x foot ap left]
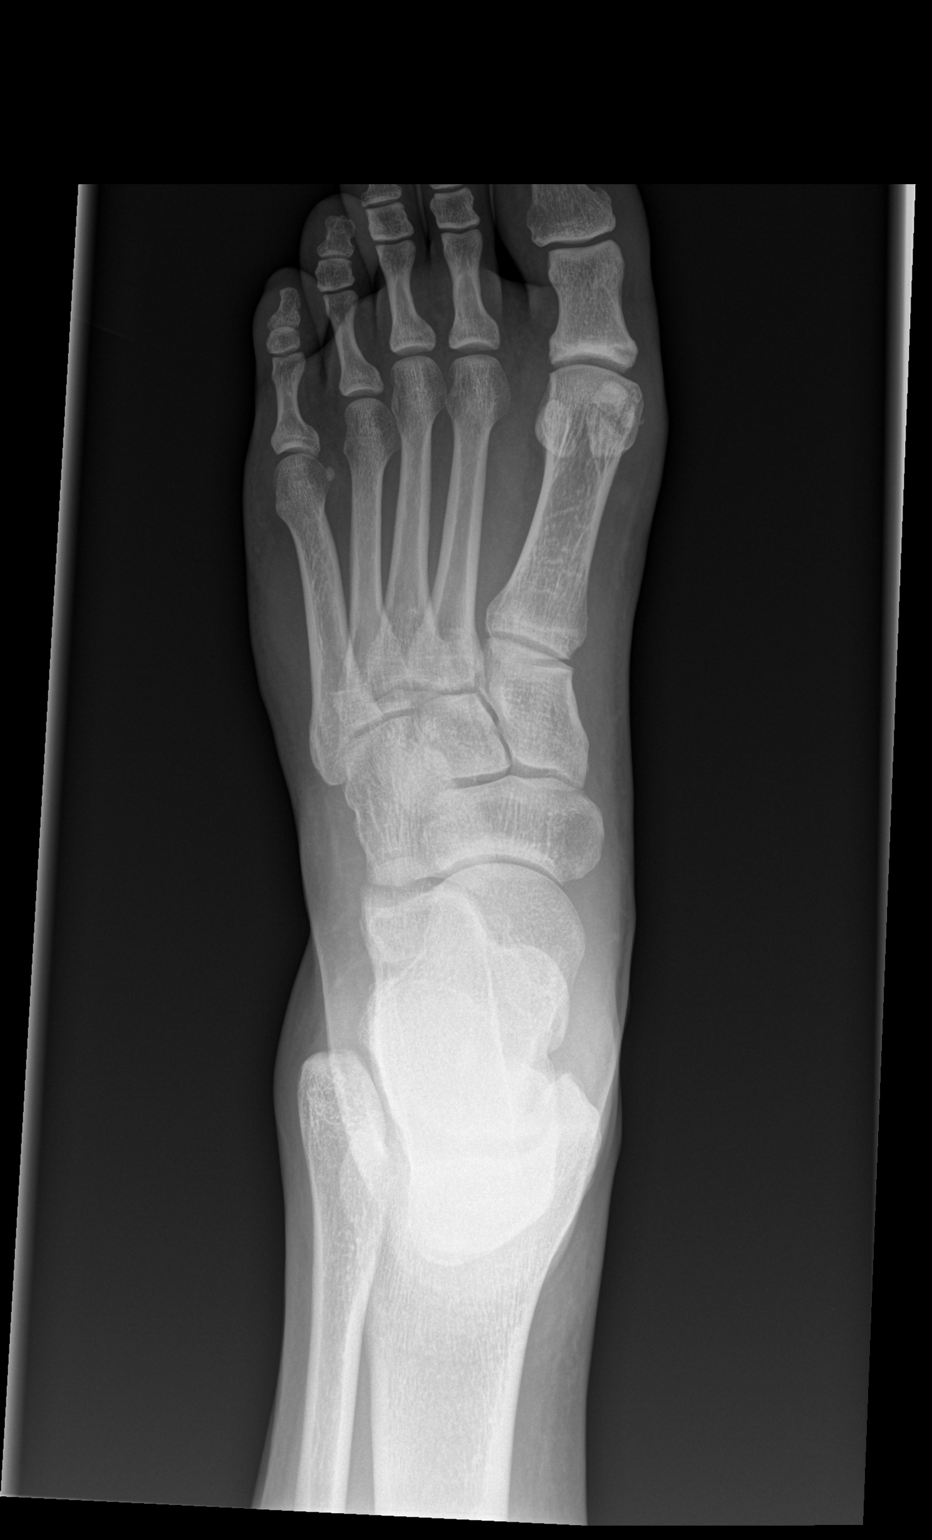

[x foot obl left]
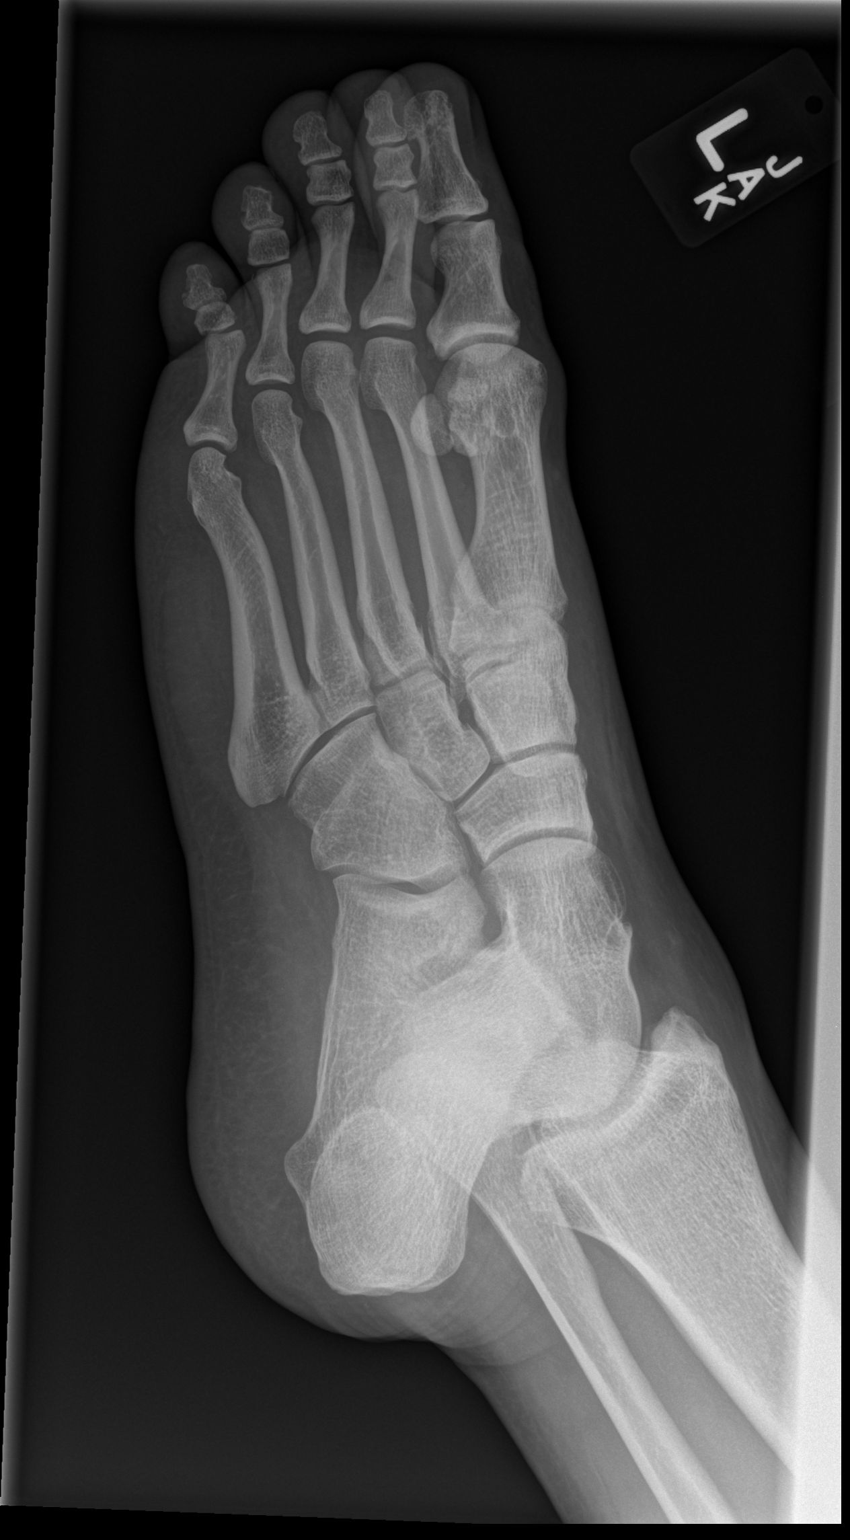

[x foot lat left]
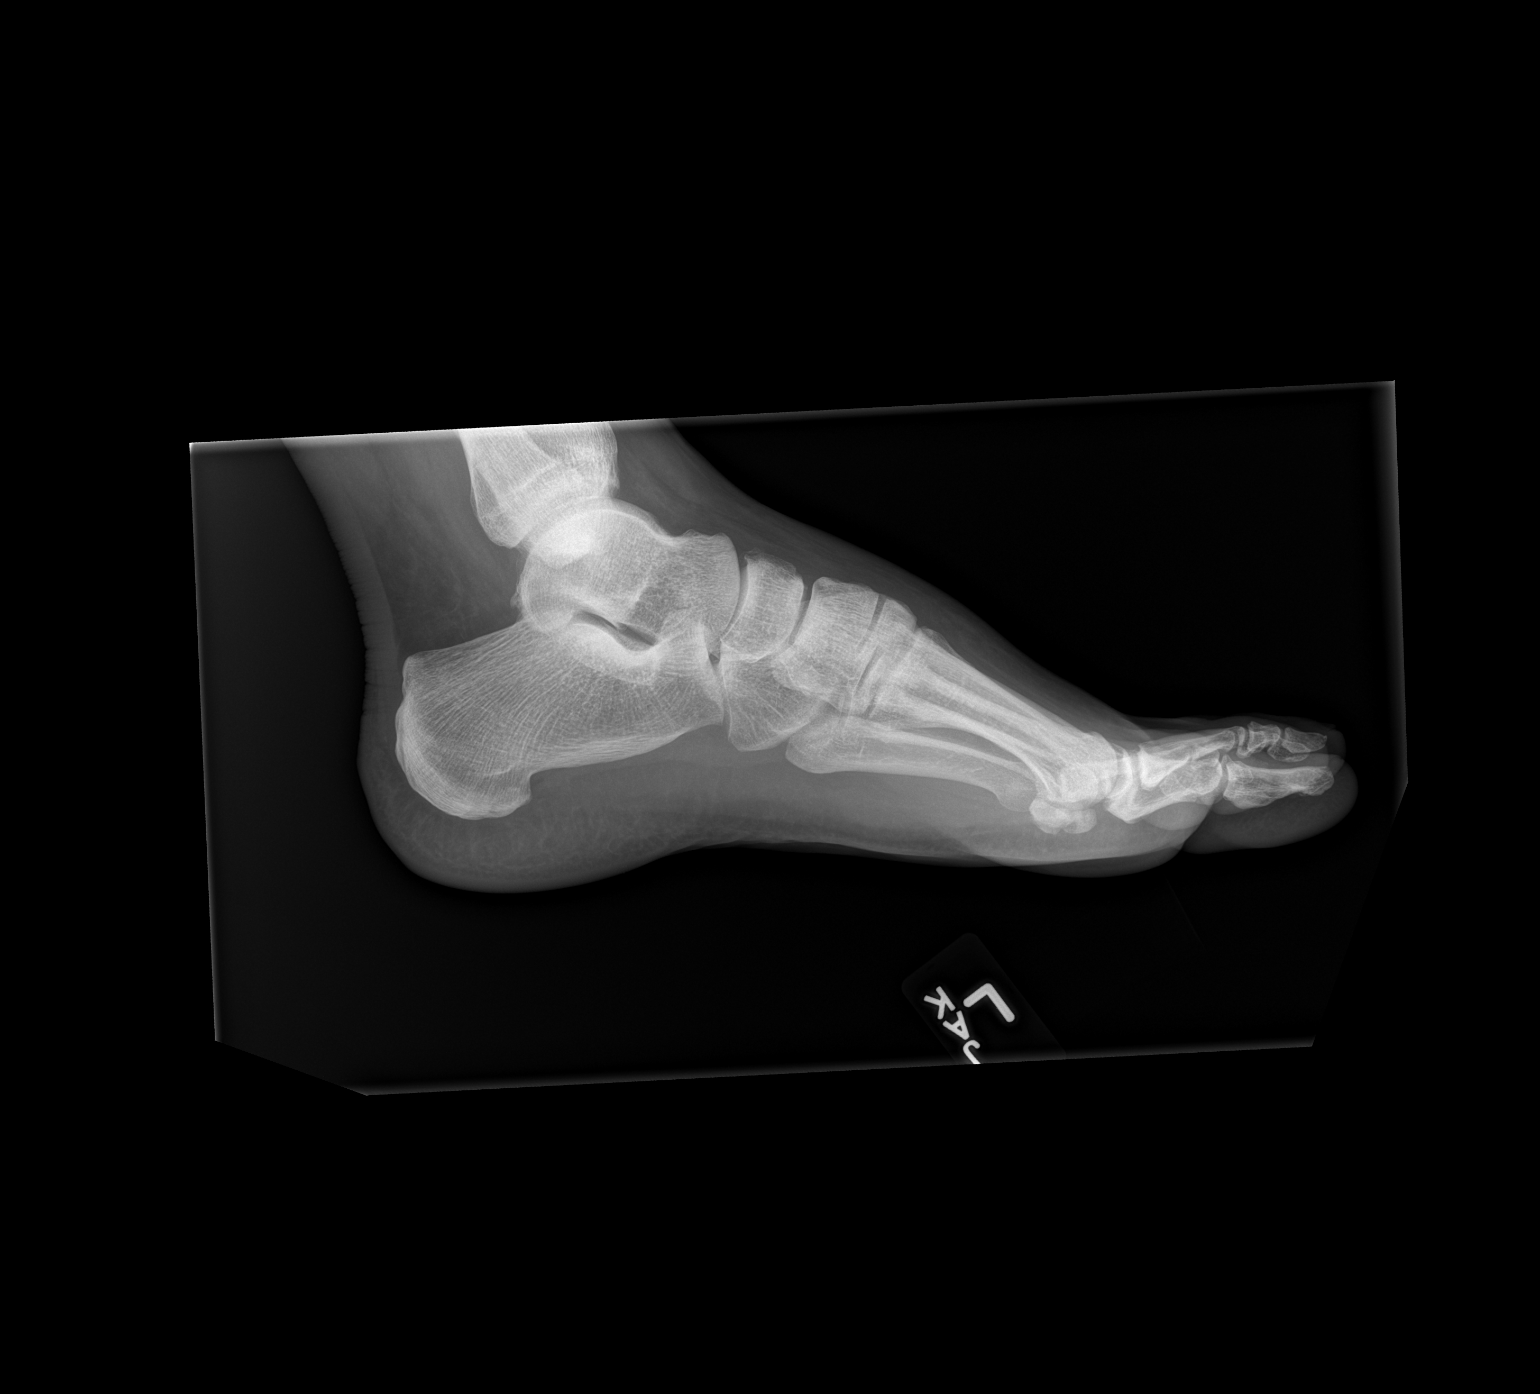

[3 of 3 positions shown; findings below may reference images not displayed]

FINDINGS: There is no evidence of fracture or dislocation. There is no
evidence of arthropathy or other focal bone abnormality. Soft
tissues are unremarkable.
IMPRESSION: Negative.

## 2019-11-06 IMAGING — CR DG FOOT 2V*L*
1 series · 1 of 1 positions shown · non-contrast
Comparison: 03/13/2018

CLINICAL DATA: Weight-bearing foot.

EXAM:
LEFT FOOT - 2 VIEW

[x foot ap left]
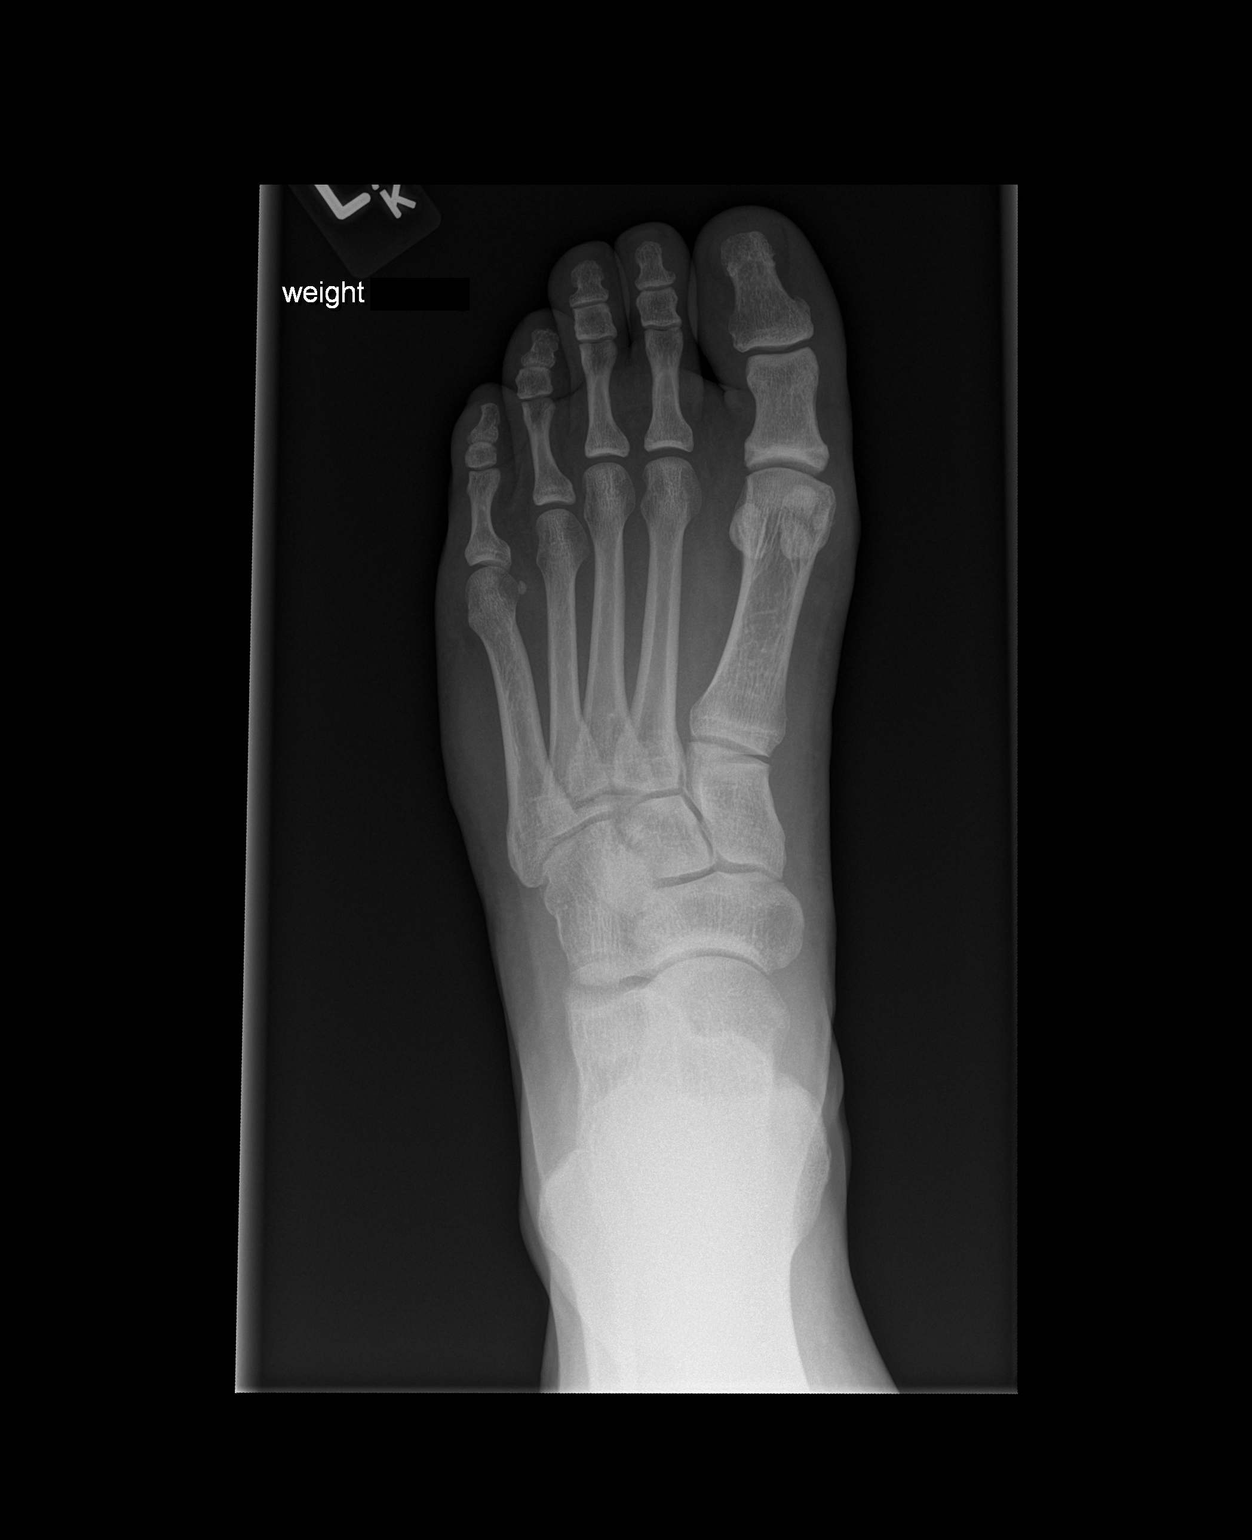

[1 of 1 positions shown; findings below may reference images not displayed]

FINDINGS: Single weight-bearing view of the left foot obtained. No evidence of
acute fracture or dislocation. Lisfranc joint appears intact. Soft
tissues are unremarkable.
IMPRESSION: Negative.

## 2020-11-19 DEATH — deceased
